# Patient Record
Sex: Male | Born: 1970 | Race: White | Hispanic: No | Marital: Married | Smoking: Current every day smoker
Health system: Southern US, Community
[De-identification: ages and names within clinical notes are randomized; demographics above are authoritative.]

## PROBLEM LIST (undated history)

## (undated) DIAGNOSIS — R011 Cardiac murmur, unspecified: Secondary | ICD-10-CM

## (undated) HISTORY — PX: NO PAST SURGERIES: SHX2092

---

## 2005-01-11 ENCOUNTER — Emergency Department: Payer: Self-pay | Admitting: Emergency Medicine

## 2005-07-03 ENCOUNTER — Emergency Department: Payer: Self-pay | Admitting: Internal Medicine

## 2006-10-14 ENCOUNTER — Emergency Department: Payer: Self-pay | Admitting: Emergency Medicine

## 2008-03-28 ENCOUNTER — Emergency Department: Payer: Self-pay | Admitting: Emergency Medicine

## 2008-08-01 ENCOUNTER — Emergency Department: Payer: Self-pay | Admitting: Internal Medicine

## 2011-07-04 ENCOUNTER — Observation Stay: Payer: Self-pay | Admitting: Internal Medicine

## 2011-07-04 LAB — CBC WITH DIFFERENTIAL/PLATELET
Basophil %: 0.2 %
Eosinophil %: 0.8 %
HCT: 50.5 % (ref 40.0–52.0)
HGB: 17.3 g/dL (ref 13.0–18.0)
Lymphocyte %: 18.7 %
MCH: 31.6 pg (ref 26.0–34.0)
MCHC: 34.2 g/dL (ref 32.0–36.0)
MCV: 92 fL (ref 80–100)
Monocyte #: 1 10*3/uL — ABNORMAL HIGH (ref 0.0–0.7)
Monocyte %: 7.1 %
Neutrophil %: 73.2 %
RDW: 12.6 % (ref 11.5–14.5)
WBC: 14.2 10*3/uL — ABNORMAL HIGH (ref 3.8–10.6)

## 2011-07-04 LAB — URINALYSIS, COMPLETE
Bacteria: NONE SEEN
Blood: NEGATIVE
Glucose,UR: NEGATIVE mg/dL (ref 0–75)
Leukocyte Esterase: NEGATIVE
Nitrite: NEGATIVE
Ph: 6 (ref 4.5–8.0)
RBC,UR: NONE SEEN /HPF (ref 0–5)
Squamous Epithelial: NONE SEEN
WBC UR: NONE SEEN /HPF (ref 0–5)

## 2011-07-04 LAB — COMPREHENSIVE METABOLIC PANEL
Albumin: 5.3 g/dL — ABNORMAL HIGH (ref 3.4–5.0)
Alkaline Phosphatase: 45 U/L — ABNORMAL LOW (ref 50–136)
Anion Gap: 12 (ref 7–16)
BUN: 11 mg/dL (ref 7–18)
Bilirubin,Total: 0.8 mg/dL (ref 0.2–1.0)
Calcium, Total: 10.1 mg/dL (ref 8.5–10.1)
Creatinine: 0.98 mg/dL (ref 0.60–1.30)
EGFR (Non-African Amer.): >60
Glucose: 119 mg/dL — ABNORMAL HIGH (ref 65–99)
Osmolality: 286 (ref 275–301)
Total Protein: 8.7 g/dL — ABNORMAL HIGH (ref 6.4–8.2)

## 2011-07-04 LAB — LIPASE, BLOOD: Lipase: 168 U/L (ref 73–393)

## 2011-07-04 LAB — TROPONIN I: Troponin-I: <0.02 ng/mL

## 2011-07-05 LAB — CBC WITH DIFFERENTIAL/PLATELET
Basophil %: 0.1 %
Eosinophil #: 0.1 10*3/uL (ref 0.0–0.7)
HCT: 41 % (ref 40.0–52.0)
HGB: 13.9 g/dL (ref 13.0–18.0)
Lymphocyte #: 3.5 10*3/uL (ref 1.0–3.6)
Lymphocyte %: 32.5 %
MCH: 31.2 pg (ref 26.0–34.0)
MCHC: 33.8 g/dL (ref 32.0–36.0)
Monocyte #: 1 10*3/uL — ABNORMAL HIGH (ref 0.0–0.7)
Neutrophil %: 56.3 %
RBC: 4.44 10*6/uL (ref 4.40–5.90)

## 2011-07-09 LAB — CULTURE, BLOOD (SINGLE)

## 2011-08-20 ENCOUNTER — Emergency Department: Payer: Self-pay | Admitting: Unknown Physician Specialty

## 2011-08-20 ENCOUNTER — Emergency Department: Payer: Self-pay

## 2011-10-11 LAB — CBC
HCT: 49.4 % (ref 40.0–52.0)
MCH: 31.1 pg (ref 26.0–34.0)
MCHC: 34.9 g/dL (ref 32.0–36.0)
MCV: 89 fL (ref 80–100)
RBC: 5.54 10*6/uL (ref 4.40–5.90)
RDW: 13 % (ref 11.5–14.5)
WBC: 22 10*3/uL — ABNORMAL HIGH (ref 3.8–10.6)

## 2011-10-11 LAB — COMPREHENSIVE METABOLIC PANEL
Albumin: 4.9 g/dL (ref 3.4–5.0)
BUN: 11 mg/dL (ref 7–18)
Bilirubin,Total: 0.7 mg/dL (ref 0.2–1.0)
Calcium, Total: 9.9 mg/dL (ref 8.5–10.1)
Co2: 25 mmol/L (ref 21–32)
Creatinine: 1.12 mg/dL (ref 0.60–1.30)
EGFR (African American): >60
EGFR (Non-African Amer.): >60
Osmolality: 276 (ref 275–301)
Total Protein: 8.5 g/dL — ABNORMAL HIGH (ref 6.4–8.2)

## 2011-10-12 ENCOUNTER — Inpatient Hospital Stay: Payer: Self-pay | Admitting: Internal Medicine

## 2011-10-12 LAB — CBC WITH DIFFERENTIAL/PLATELET
Basophil #: 0.1 10*3/uL (ref 0.0–0.1)
Basophil %: 0.4 %
Eosinophil #: 0.2 10*3/uL (ref 0.0–0.7)
HCT: 44.3 % (ref 40.0–52.0)
HGB: 15.6 g/dL (ref 13.0–18.0)
Lymphocyte %: 24.3 %
MCH: 31.7 pg (ref 26.0–34.0)
MCHC: 35.3 g/dL (ref 32.0–36.0)
MCV: 90 fL (ref 80–100)
Monocyte %: 9 %
Neutrophil #: 9.1 10*3/uL — ABNORMAL HIGH (ref 1.4–6.5)
Neutrophil %: 64.7 %
Platelet: 247 10*3/uL (ref 150–440)
WBC: 14.1 10*3/uL — ABNORMAL HIGH (ref 3.8–10.6)

## 2011-10-12 LAB — OCCULT BLOOD X 1 CARD TO LAB, STOOL: Occult Blood, Feces: NEGATIVE

## 2011-10-14 LAB — STOOL CULTURE

## 2011-10-17 LAB — CULTURE, BLOOD (SINGLE)

## 2011-11-06 LAB — CBC WITH DIFFERENTIAL/PLATELET
Basophil %: 0.9 %
Eosinophil %: 3.8 %
Lymphocyte #: 3.3 10*3/uL (ref 1.0–3.6)
MCH: 31.4 pg (ref 26.0–34.0)
MCHC: 34.7 g/dL (ref 32.0–36.0)
Monocyte #: 0.8 x10 3/mm (ref 0.2–1.0)
Neutrophil %: 52.7 %
Platelet: 262 10*3/uL (ref 150–440)
RBC: 5.44 10*6/uL (ref 4.40–5.90)
RDW: 12.9 % (ref 11.5–14.5)

## 2011-11-06 LAB — ACETAMINOPHEN LEVEL: Acetaminophen: 8 ug/mL — ABNORMAL LOW

## 2011-11-06 LAB — COMPREHENSIVE METABOLIC PANEL
Albumin: 4.8 g/dL (ref 3.4–5.0)
Bilirubin,Total: 0.5 mg/dL (ref 0.2–1.0)
Chloride: 105 mmol/L (ref 98–107)
Co2: 27 mmol/L (ref 21–32)
EGFR (African American): >60
Glucose: 89 mg/dL (ref 65–99)
Potassium: 3.8 mmol/L (ref 3.5–5.1)

## 2011-11-06 LAB — DRUG SCREEN, URINE
Barbiturates, Ur Screen: NEGATIVE (ref ?–200)
Cannabinoid 50 Ng, Ur ~~LOC~~: NEGATIVE (ref ?–50)
Methadone, Ur Screen: NEGATIVE (ref ?–300)
Opiate, Ur Screen: POSITIVE (ref ?–300)
Phencyclidine (PCP) Ur S: NEGATIVE (ref ?–25)
Tricyclic, Ur Screen: NEGATIVE (ref ?–1000)

## 2011-11-06 LAB — SALICYLATE LEVEL: Salicylates, Serum: 4.3 mg/dL — ABNORMAL HIGH

## 2011-11-07 ENCOUNTER — Inpatient Hospital Stay: Payer: Self-pay | Admitting: Psychiatry

## 2012-10-04 ENCOUNTER — Emergency Department: Payer: Self-pay | Admitting: Emergency Medicine

## 2012-10-04 LAB — COMPREHENSIVE METABOLIC PANEL
Bilirubin,Total: 0.9 mg/dL (ref 0.2–1.0)
Calcium, Total: 10.3 mg/dL — ABNORMAL HIGH (ref 8.5–10.1)
Co2: 24 mmol/L (ref 21–32)
Glucose: 140 mg/dL — ABNORMAL HIGH (ref 65–99)
Potassium: 3.8 mmol/L (ref 3.5–5.1)
SGPT (ALT): 25 U/L (ref 12–78)
Sodium: 139 mmol/L (ref 136–145)

## 2012-10-04 LAB — URINALYSIS, COMPLETE
Glucose,UR: NEGATIVE mg/dL (ref 0–75)
Nitrite: NEGATIVE
Ph: 6 (ref 4.5–8.0)
Protein: 150
Specific Gravity: 1.025 (ref 1.003–1.030)

## 2012-10-04 LAB — CBC
HCT: 54.9 % — ABNORMAL HIGH (ref 40.0–52.0)
HGB: 18.9 g/dL — ABNORMAL HIGH (ref 13.0–18.0)
HGB: 16.6 g/dL (ref 13.0–18.0)
MCH: 29.7 pg (ref 26.0–34.0)
MCHC: 34.8 g/dL (ref 32.0–36.0)
MCV: 86 fL (ref 80–100)
MCV: 87 fL (ref 80–100)
RDW: 13.1 % (ref 11.5–14.5)
RDW: 13.4 % (ref 11.5–14.5)
WBC: 24.8 10*3/uL — ABNORMAL HIGH (ref 3.8–10.6)

## 2012-10-04 LAB — LIPASE, BLOOD: Lipase: 134 U/L (ref 73–393)

## 2012-10-04 LAB — DRUG SCREEN, URINE
Amphetamines, Ur Screen: NEGATIVE (ref ?–1000)
Cannabinoid 50 Ng, Ur ~~LOC~~: NEGATIVE (ref ?–50)
Phencyclidine (PCP) Ur S: NEGATIVE (ref ?–25)
Tricyclic, Ur Screen: NEGATIVE (ref ?–1000)

## 2012-10-05 ENCOUNTER — Inpatient Hospital Stay: Payer: Self-pay | Admitting: Internal Medicine

## 2012-10-05 ENCOUNTER — Emergency Department: Payer: Self-pay | Admitting: Emergency Medicine

## 2012-10-05 LAB — URINALYSIS, COMPLETE
Blood: NEGATIVE
Glucose,UR: NEGATIVE mg/dL (ref 0–75)
Ketone: NEGATIVE
Ph: 6 (ref 4.5–8.0)
Protein: 25
RBC,UR: 2 /HPF (ref 0–5)
Specific Gravity: 1.03 (ref 1.003–1.030)
Squamous Epithelial: 1

## 2012-10-05 LAB — CBC WITH DIFFERENTIAL/PLATELET
Basophil #: 0 10*3/uL (ref 0.0–0.1)
Basophil %: 0.1 %
Eosinophil %: 0.1 %
Lymphocyte #: 1.5 10*3/uL (ref 1.0–3.6)
MCH: 30.2 pg (ref 26.0–34.0)
MCHC: 34.9 g/dL (ref 32.0–36.0)
MCV: 87 fL (ref 80–100)
Monocyte #: 2.2 x10 3/mm — ABNORMAL HIGH (ref 0.2–1.0)
Monocyte %: 7.8 %
Neutrophil #: 24.1 10*3/uL — ABNORMAL HIGH (ref 1.4–6.5)
Neutrophil %: 86.7 %
RDW: 13 % (ref 11.5–14.5)
WBC: 27.9 10*3/uL — ABNORMAL HIGH (ref 3.8–10.6)

## 2012-10-06 LAB — CBC WITH DIFFERENTIAL/PLATELET
Basophil %: 0.4 %
Eosinophil #: 0.1 10*3/uL (ref 0.0–0.7)
HCT: 42.3 % (ref 40.0–52.0)
Lymphocyte #: 2.8 10*3/uL (ref 1.0–3.6)
MCH: 30.5 pg (ref 26.0–34.0)
MCHC: 35.4 g/dL (ref 32.0–36.0)
MCV: 86 fL (ref 80–100)
Monocyte #: 1.8 x10 3/mm — ABNORMAL HIGH (ref 0.2–1.0)
Monocyte %: 11.1 %
Neutrophil #: 11.1 10*3/uL — ABNORMAL HIGH (ref 1.4–6.5)
Neutrophil %: 70.2 %
Platelet: 197 10*3/uL (ref 150–440)
RDW: 12.8 % (ref 11.5–14.5)
WBC: 15.8 10*3/uL — ABNORMAL HIGH (ref 3.8–10.6)

## 2012-10-06 LAB — BASIC METABOLIC PANEL
Anion Gap: 6 — ABNORMAL LOW (ref 7–16)
BUN: 28 mg/dL — ABNORMAL HIGH (ref 7–18)
Calcium, Total: 8.3 mg/dL — ABNORMAL LOW (ref 8.5–10.1)
Chloride: 107 mmol/L (ref 98–107)
Co2: 27 mmol/L (ref 21–32)
EGFR (African American): >60
Glucose: 107 mg/dL — ABNORMAL HIGH (ref 65–99)
Osmolality: 285 (ref 275–301)
Potassium: 3.2 mmol/L — ABNORMAL LOW (ref 3.5–5.1)

## 2012-10-06 LAB — CLOSTRIDIUM DIFFICILE BY PCR

## 2012-10-06 LAB — WBCS, STOOL

## 2012-10-11 LAB — CULTURE, BLOOD (SINGLE)

## 2013-02-23 IMAGING — CR DG ABDOMEN 3V
1 series · 6 of 6 positions shown · non-contrast
Comparison: none

REASON FOR EXAM: vomiting abd pain
COMMENTS:

[Series 1: w chest pa · 0.14mm/px · 6 of 6 slices shown]
[im 1/6]
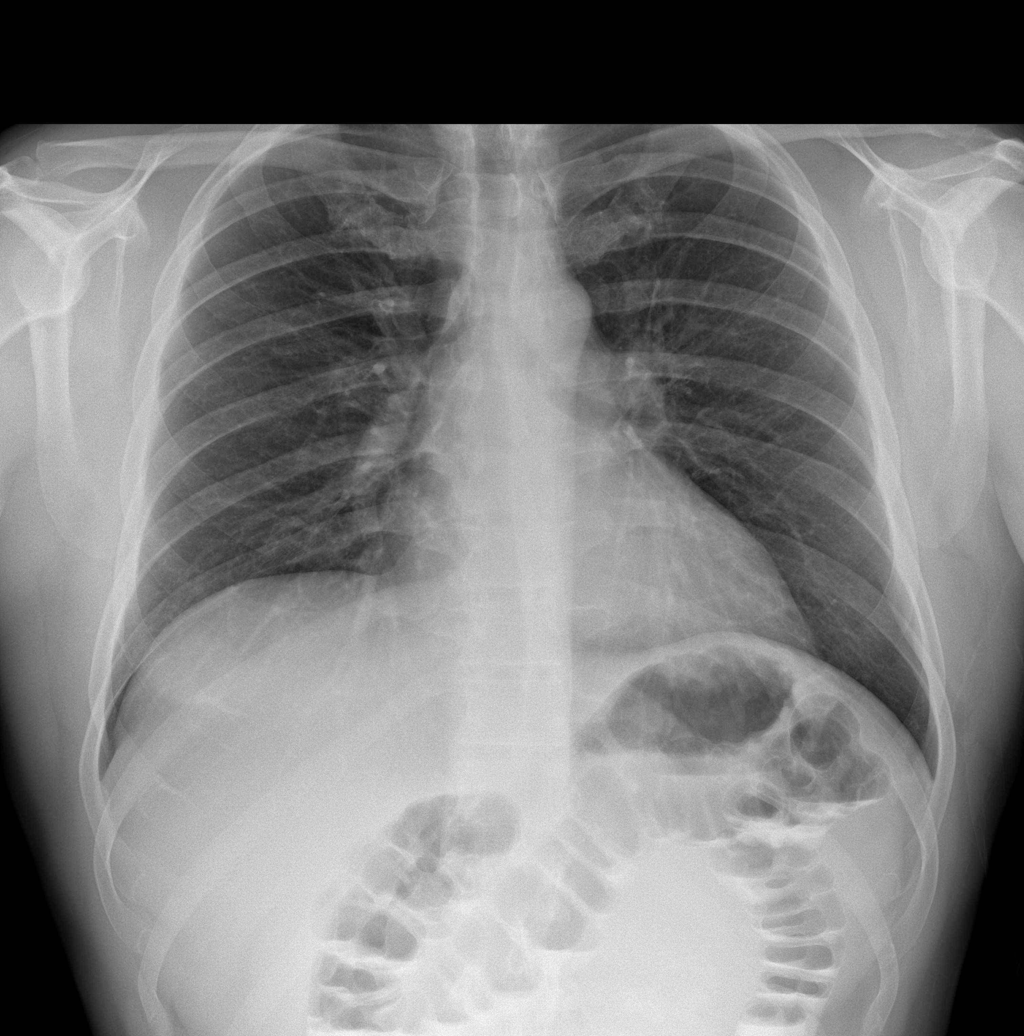
[im 2/6]
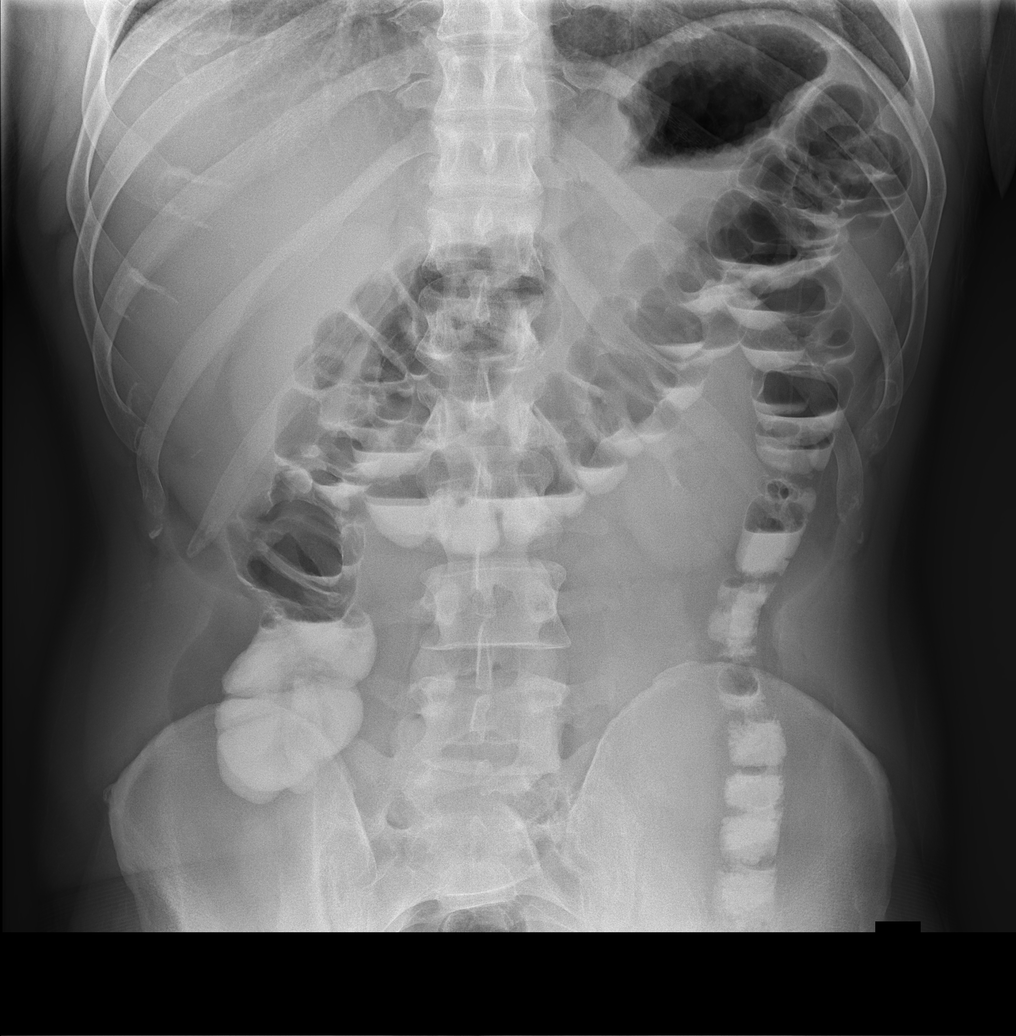
[im 3/6]
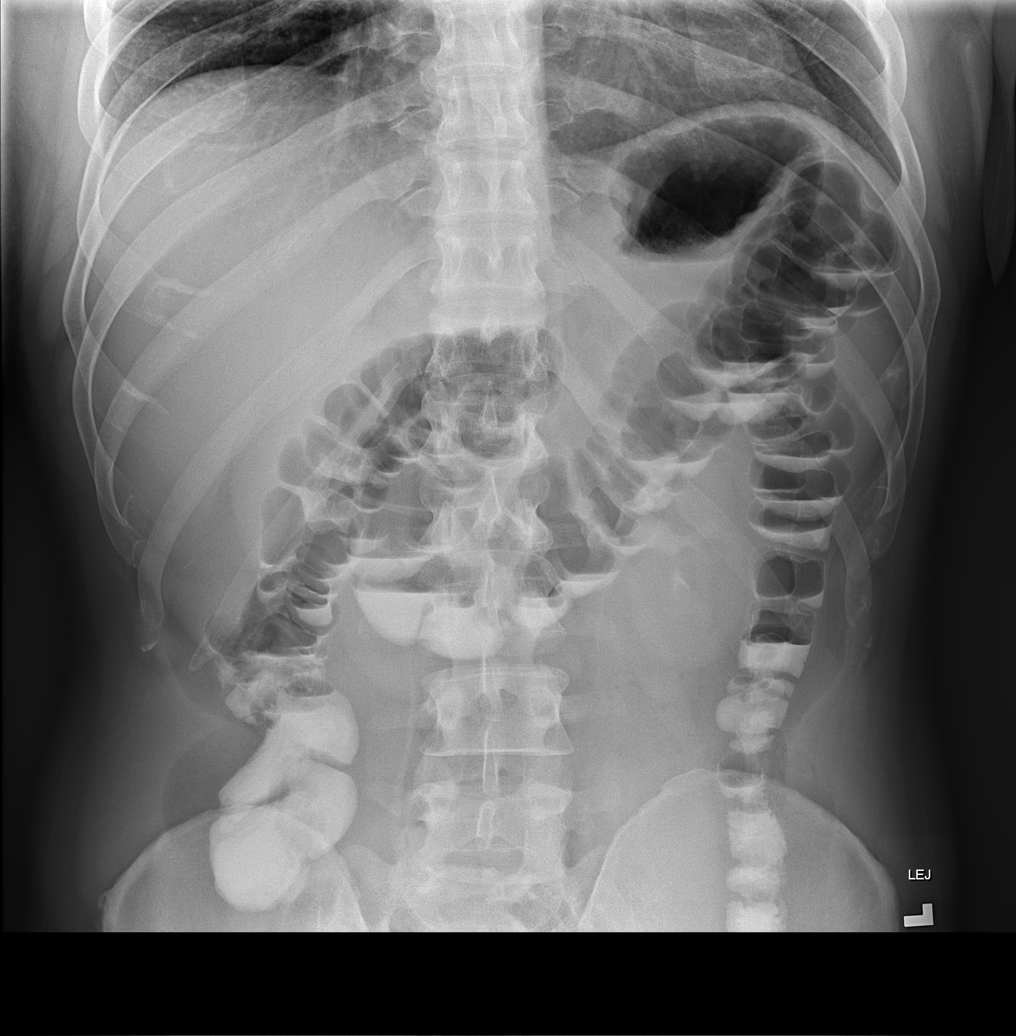
[im 4/6]
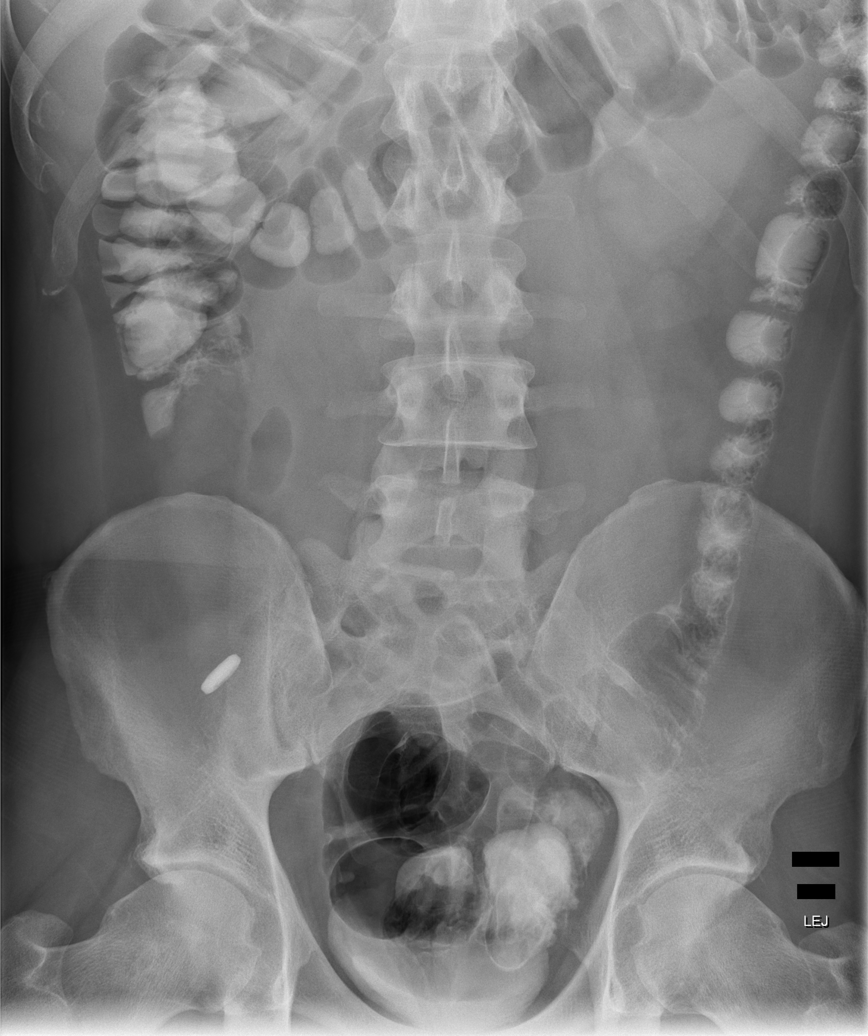
[im 5/6]
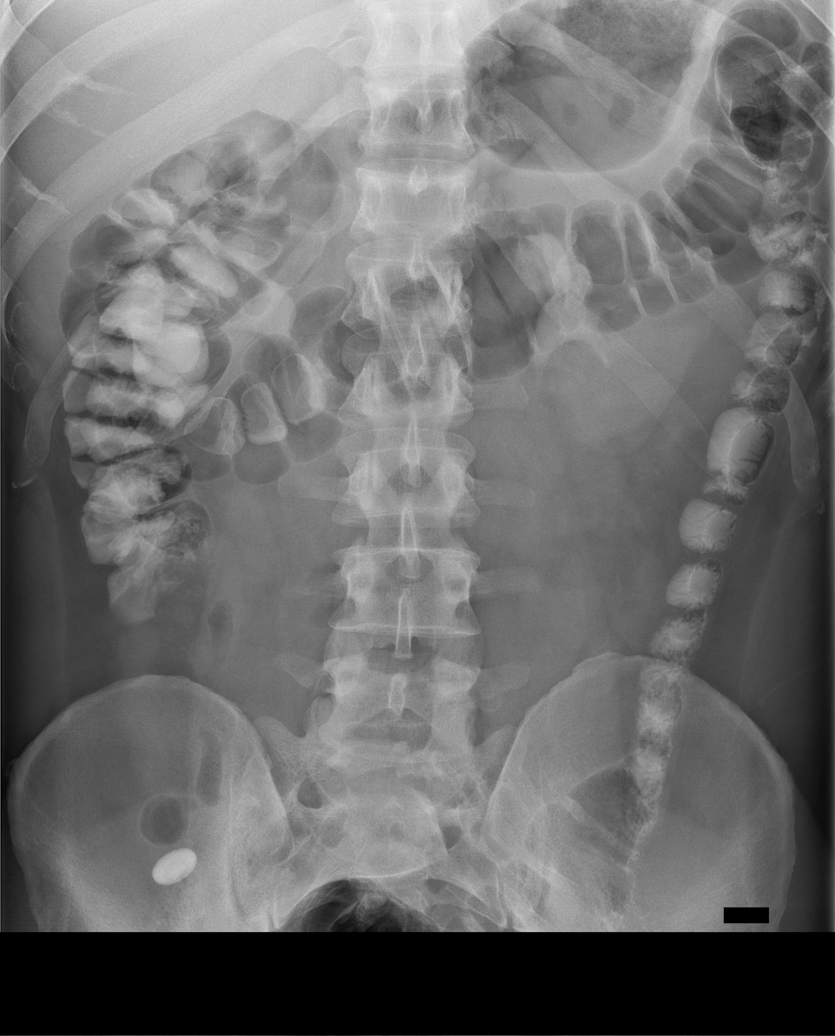
[im 6/6]
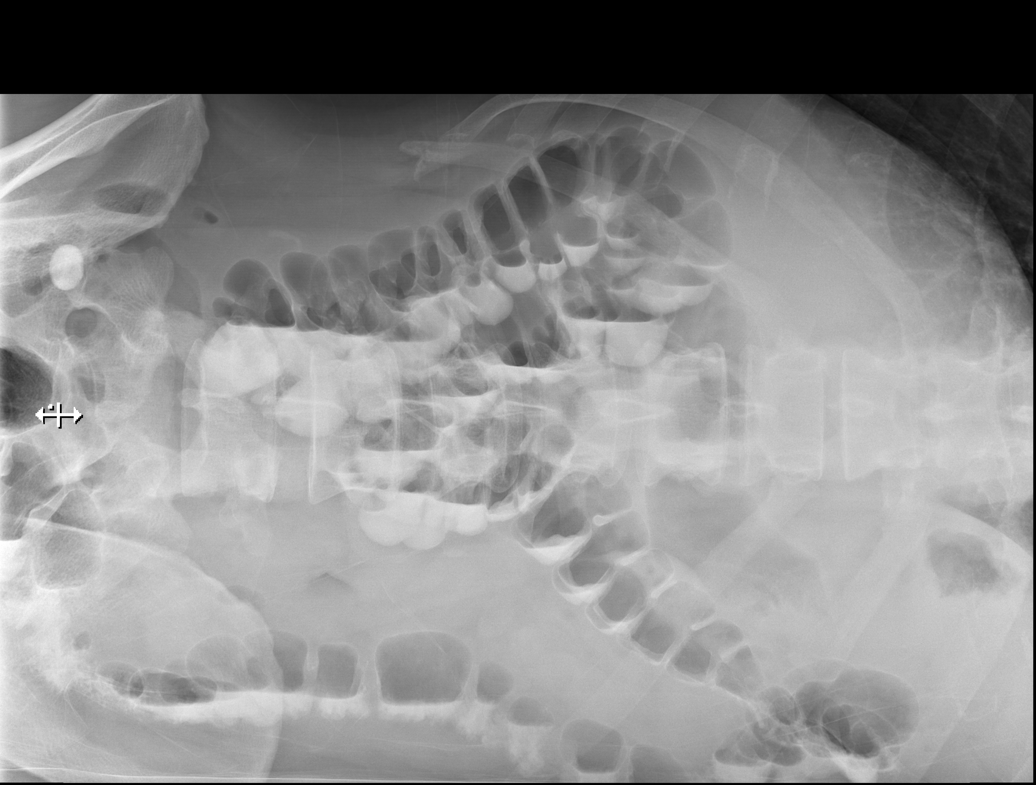

[6 of 6 positions shown; findings below may reference images not displayed]

PROCEDURE:     DXR - DXR ABDOMEN 3-WAY (INCL PA CXR)  - July 04, 2011  [DATE]

RESULT:     There is mild elevation of the right hemidiaphragm. There is
slightly shallow inspiration. Right lung base atelectasis is present. The
cardiac silhouette appears normal. There is oral contrast in the colon from
the recent CT of the abdomen and pelvis of the same date. An elliptical
density projects over the right iliac region which could represent a portion
of the patient's clothing or foreign body extrinsic to the patient or an
ingested tablet. There is excretion of contrast opacified urine by both
kidneys with accumulation in the bladder.
IMPRESSION: 1.     Shallow inspiration with right lung base atelectasis.
2.     No evidence of bowel obstruction or perforation.

## 2013-02-23 IMAGING — CT CT ABD-PELV W/ CM
1 of 2 series · 15 of 32 positions shown, 19 images · non-contrast
Comparison: none

REASON FOR EXAM: (1) abd pain; (2) pel pain
COMMENTS:

PROCEDURE:     CT  - CT ABDOMEN / PELVIS  W  - July 04, 2011 [DATE]
RESULT:     History: Pain.
Comparison study: Abdominal ultrasound of 07/04/2011. CT [DATE].

[Series 2: 3mm soft tissue · axial · 0.68mm/px · z∈[-852,-372]mm · 15 of 178 slices shown, 19 images]
[im 9/178  soft-tissue]
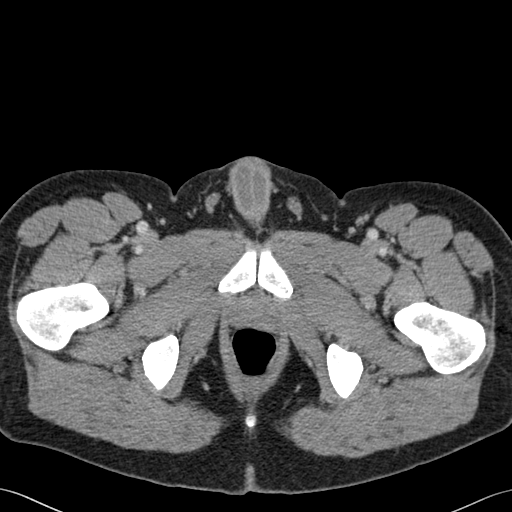
[im 9/178  bone]
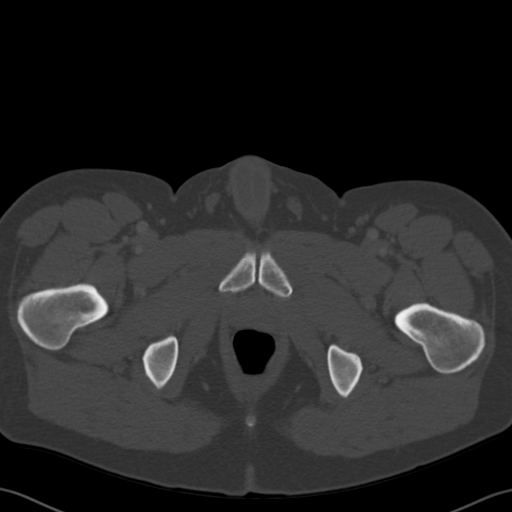
[im 25/178  soft-tissue]
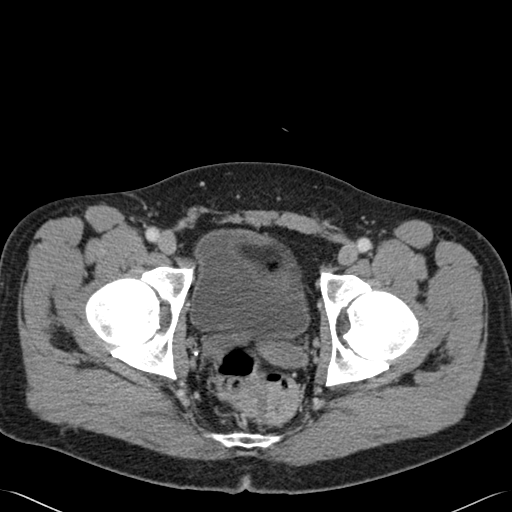
[im 41/178  soft-tissue]
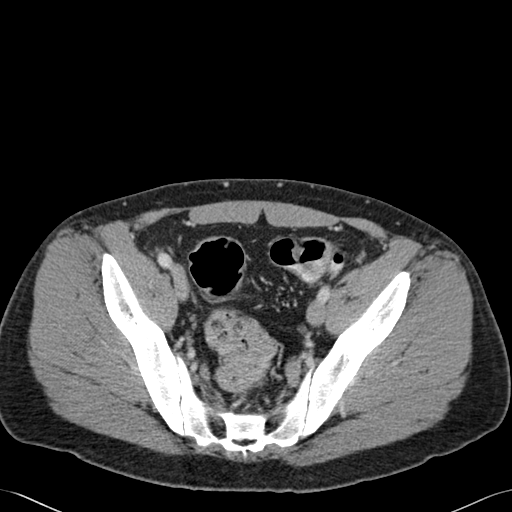
[im 49/178  soft-tissue]
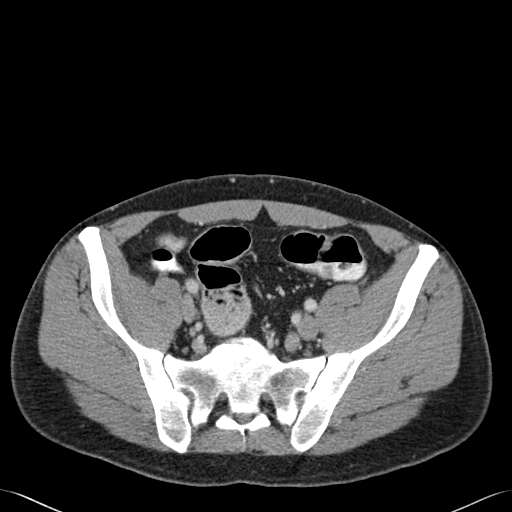
[im 65/178  soft-tissue]
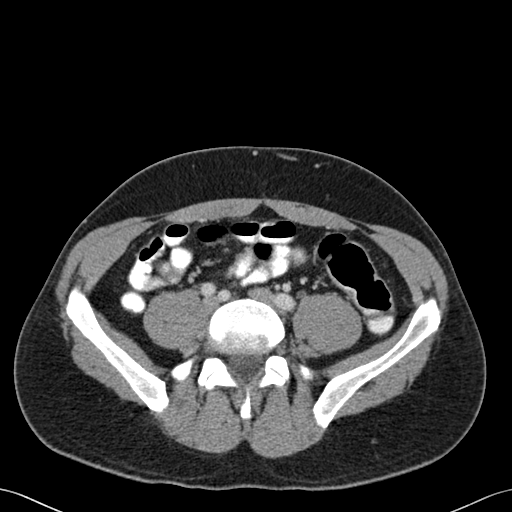
[im 73/178  soft-tissue]
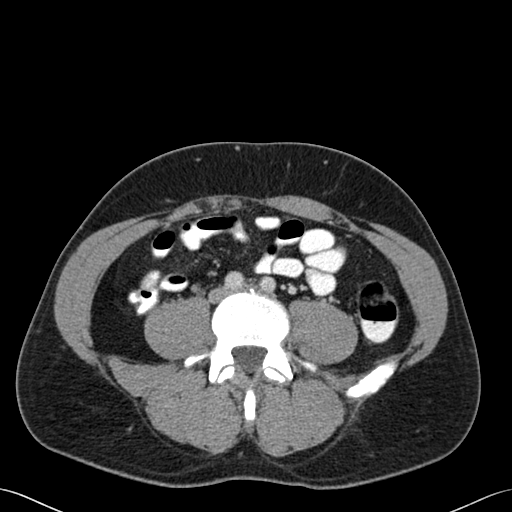
[im 89/178  soft-tissue]
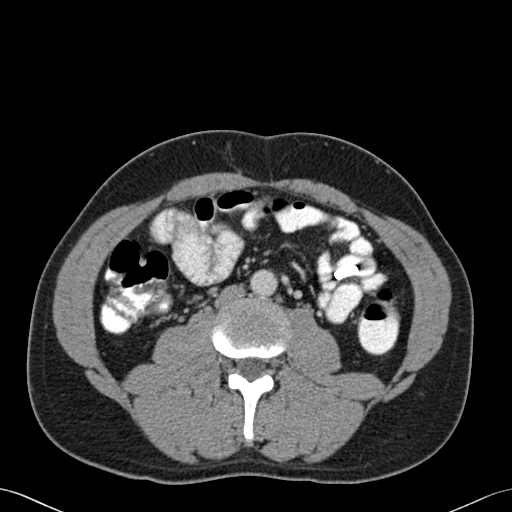
[im 105/178  soft-tissue]
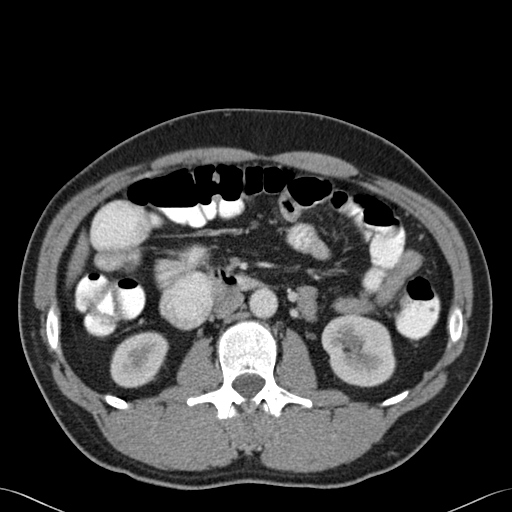
[im 113/178  soft-tissue]
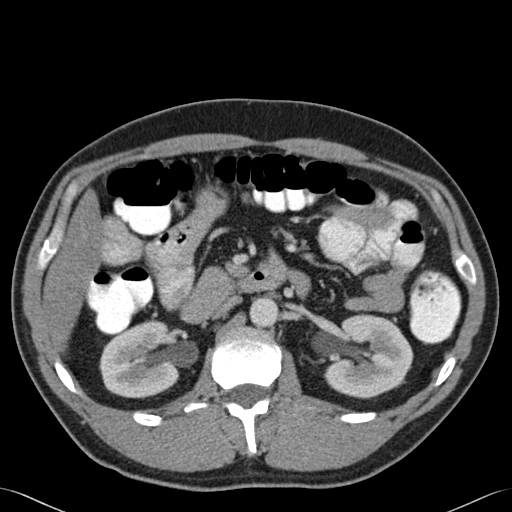
[im 113/178  bone]
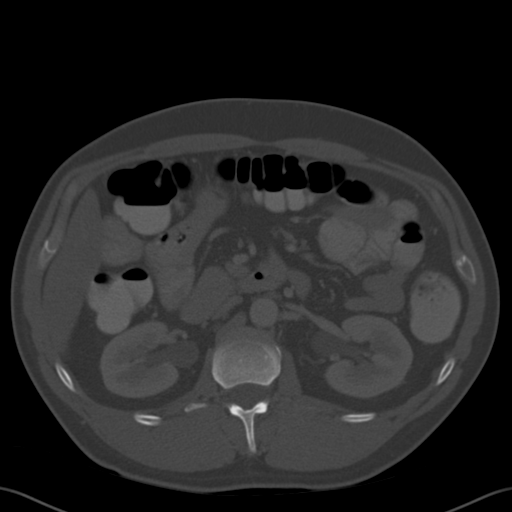
[im 129/178  soft-tissue]
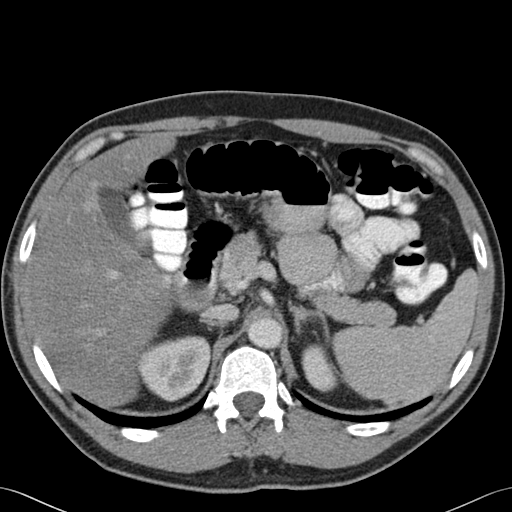
[im 137/178  soft-tissue]
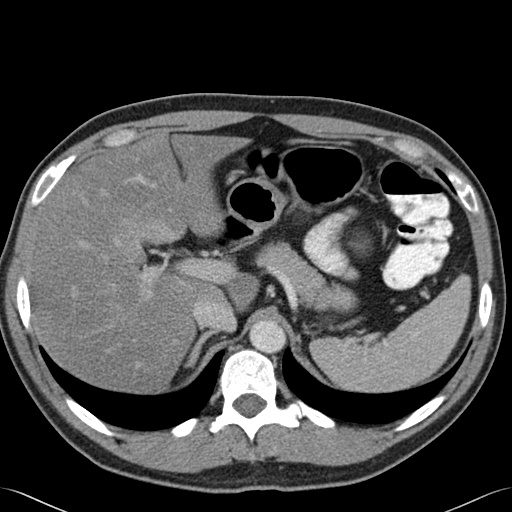
[im 145/178  lung]
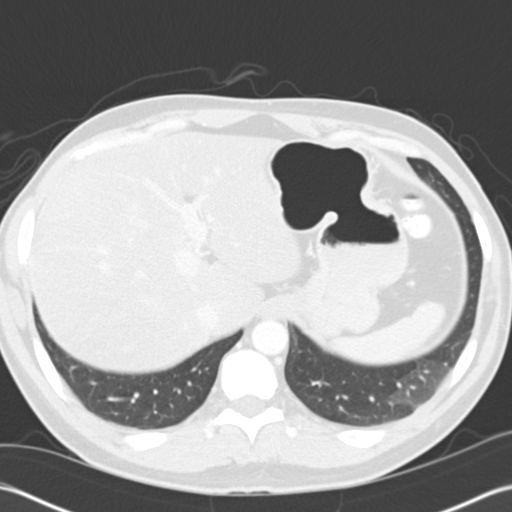
[im 153/178  soft-tissue]
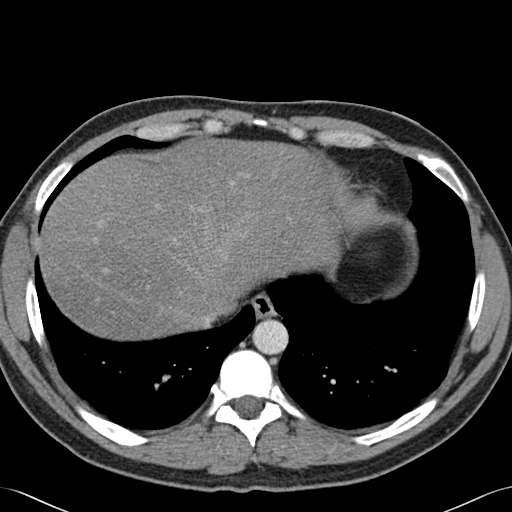
[im 153/178  lung]
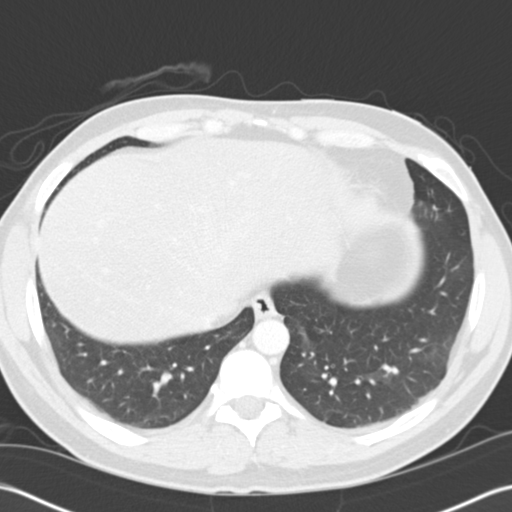
[im 161/178  lung]
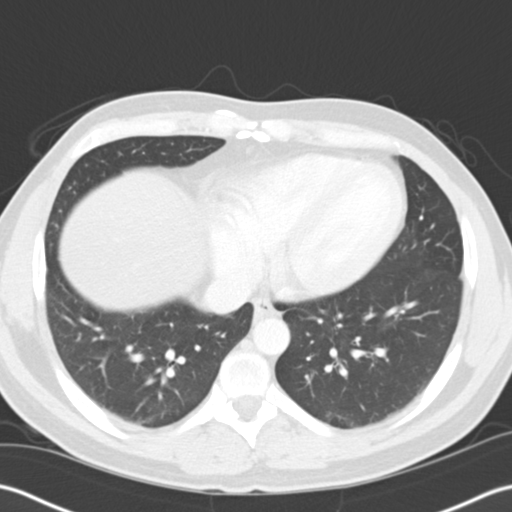
[im 169/178  soft-tissue]
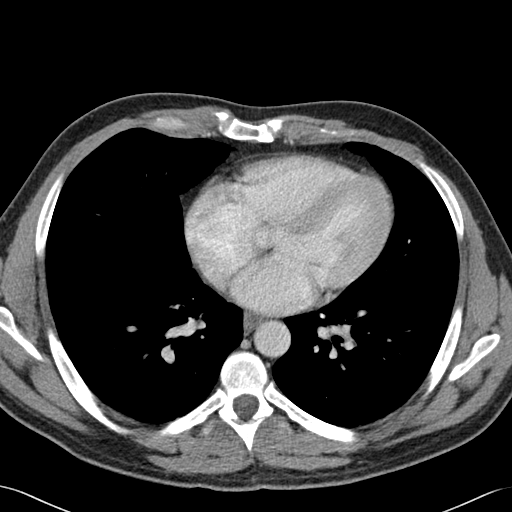
[im 169/178  lung]
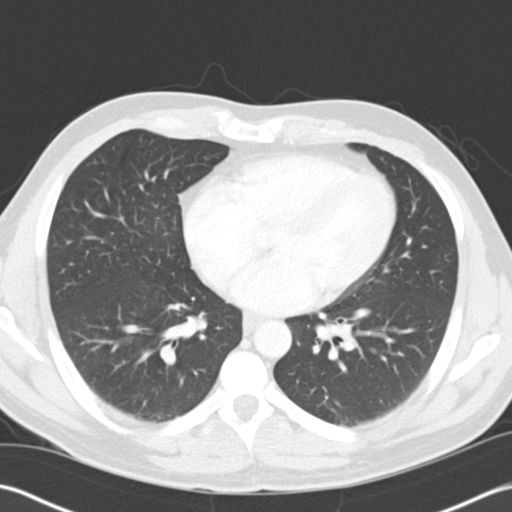

[15 of 32 positions shown; findings below may reference images not displayed]

FINDINGS: Standard CT obtained 100 cc of [id number]. Liver normal.
Gallbladder nondistended. Gallbladder wall thickness normal. No
pericholecystic fluid noted. Splenosis noted. Spleen normal. Pancreas
normal. Adrenals normal. Kidneys normal. No hydronephrosis or hydroureter.
Bladder nondistended. Calcification within the prostate. The contour the
prostate is slightly irregular. Clinical correlation suggested. Aorta
nondistended. Appendix normal. Mildly distended proximal small bowel loops
are noted. To exclude a developing small bowel obstruction followup
three-way of the abdomen is suggested. No colonic distention. No
inflammatory changes no the right or left lower quadrants. No free air. Mild
atelectasis.
IMPRESSION: 1. Mild distention of proximal small bowel loops. To exclude developing
small bowel obstruction followup three-way abdomen suggested.
2. Calcification within the prostate. Prostate contour slightly irregular.
Clinical correlation suggested.

## 2013-02-24 IMAGING — CR DG ABDOMEN 3V
1 series · 4 of 4 positions shown · non-contrast
Comparison: none

REASON FOR EXAM: f/u to r/o SBO
COMMENTS:

PROCEDURE:     DXR - DXR ABDOMEN COMPLETE  - July 05, 2011  [DATE]
RESULT:
Comparison is made to prior study dated 07-04-2011.

[Series 1: erect ap · 0.17mm/px · 4 of 4 slices shown]
[im 1/4]
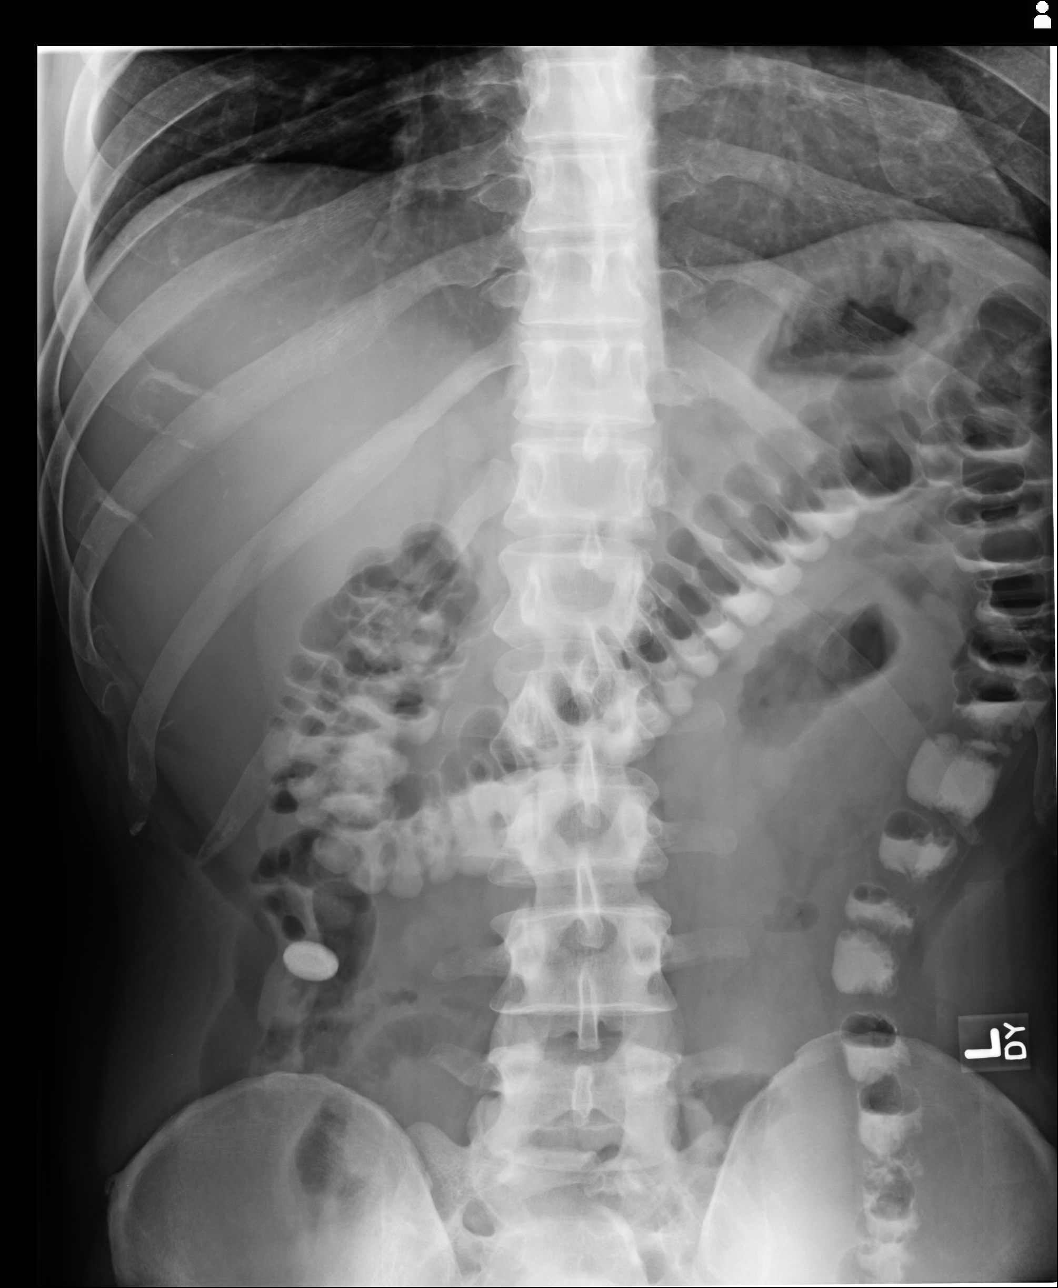
[im 2/4]
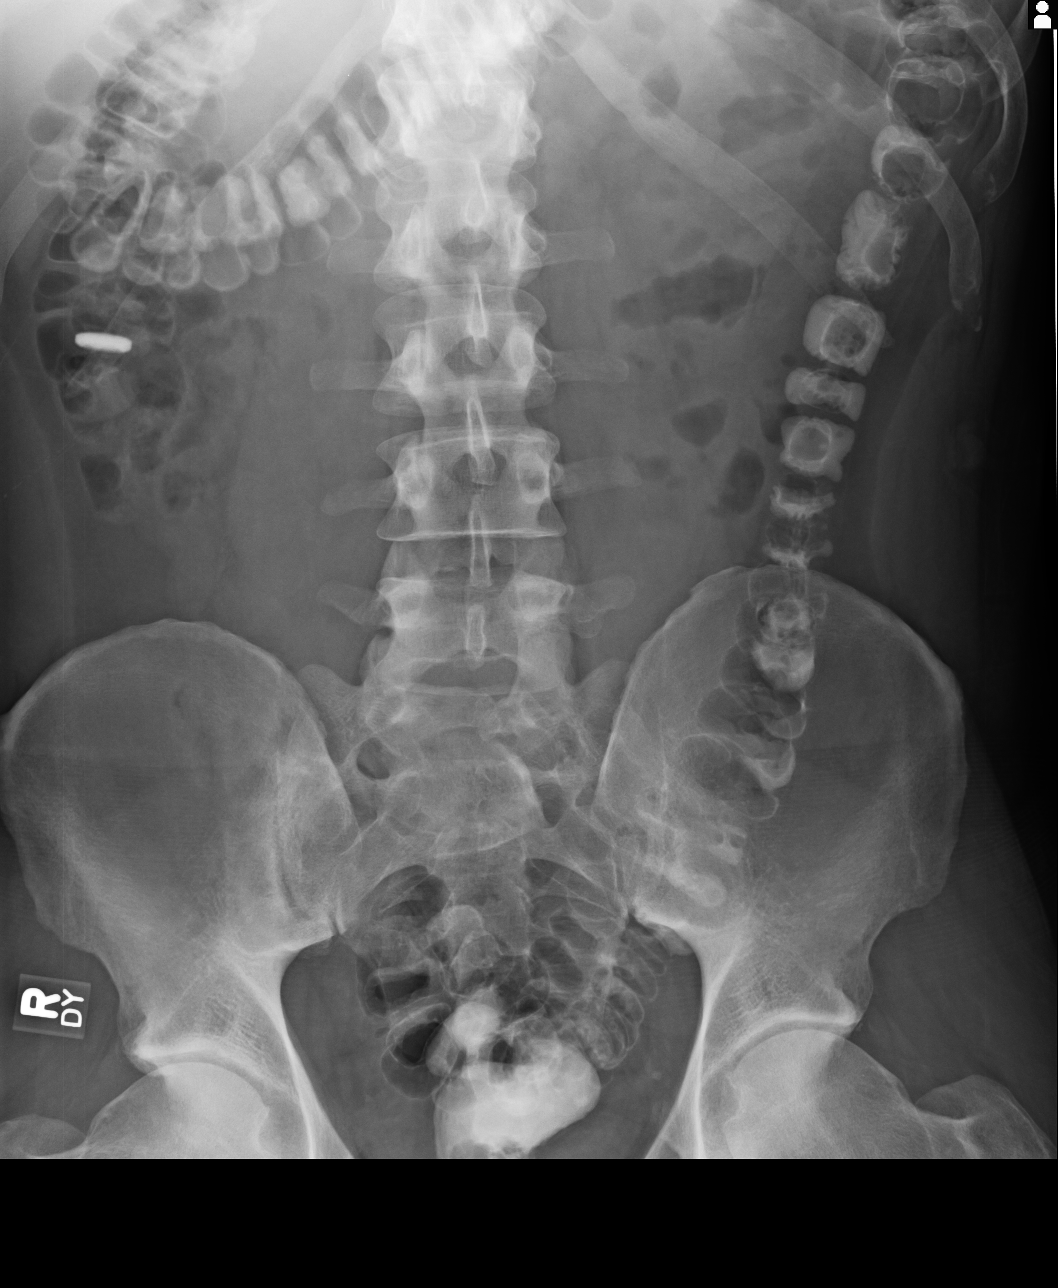
[im 3/4]
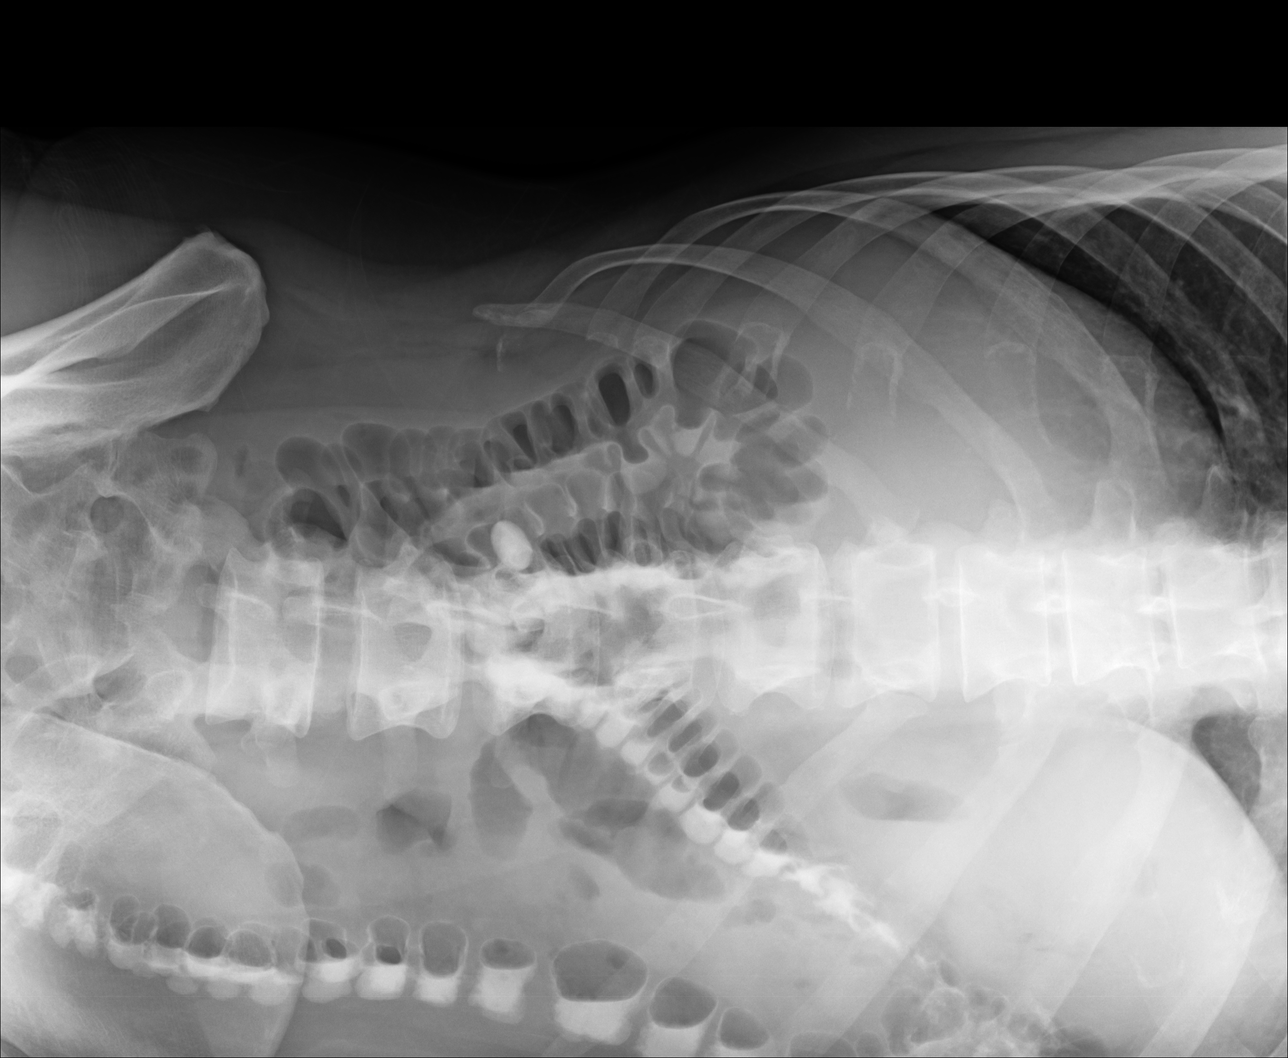
[im 4/4]
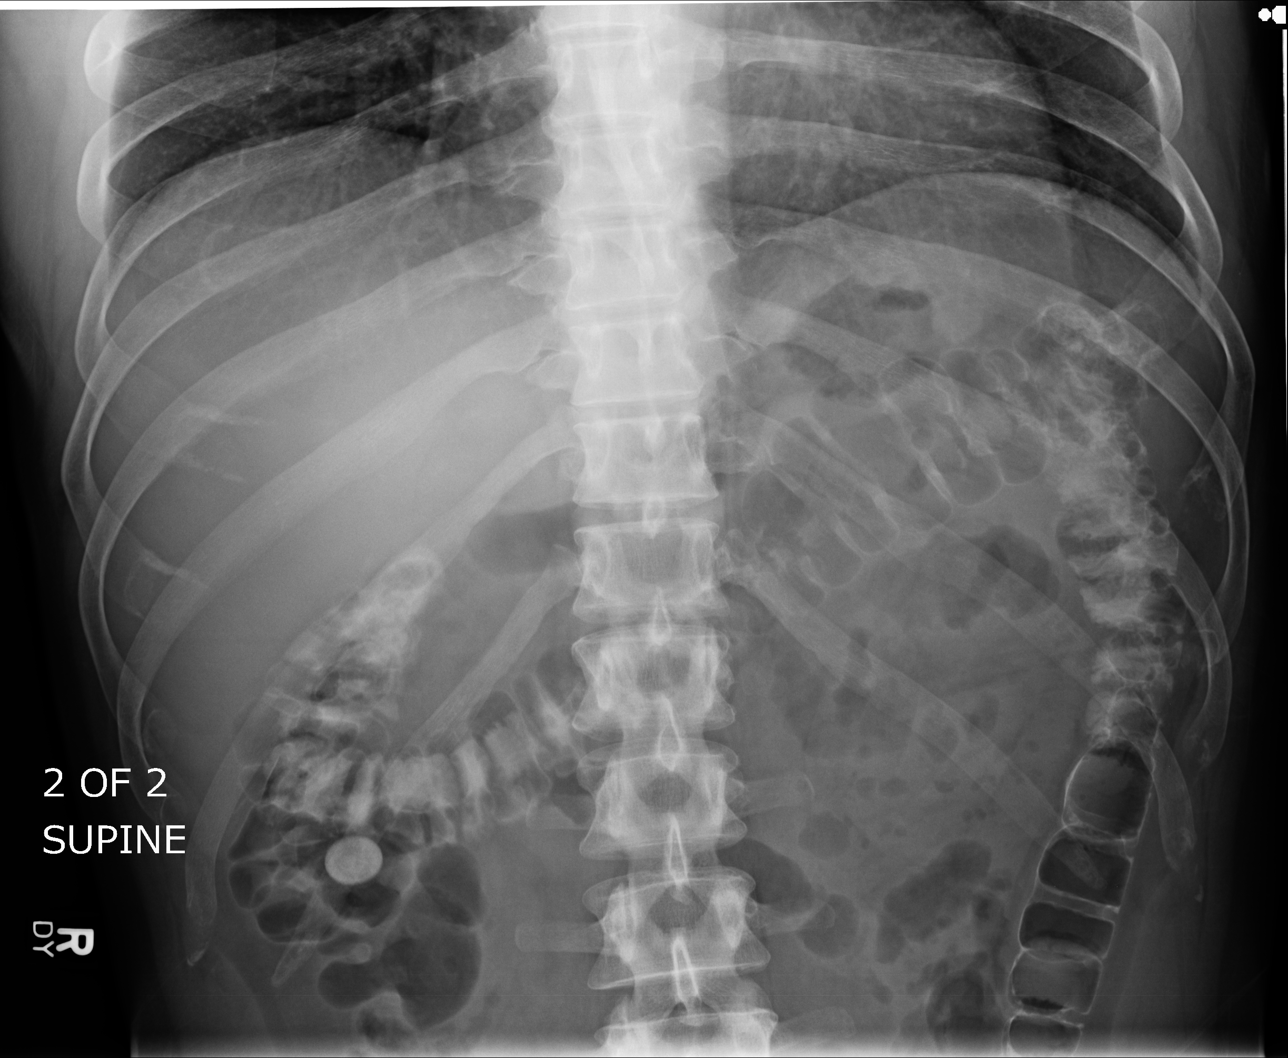

[4 of 4 positions shown; findings below may reference images not displayed]

FINDINGS: Air is seen within nondilated loops of large and small bowel as
well as small to moderate amount of residual contrast. A rounded area of
high density projects within the right lower quadrant region. This area has
a rounded appearance and may represent external artifact possibly a
previously injected foreign body, possibly metallic. The visualized bony
skeleton is unremarkable. There is no evidence of free air.
IMPRESSION: 1. Nonobstructive bowel gas pattern.

2. Residual contrast within bowel.

## 2013-04-18 ENCOUNTER — Inpatient Hospital Stay: Payer: Self-pay | Admitting: Psychiatry

## 2013-04-18 LAB — URINALYSIS, COMPLETE
Bacteria: NONE SEEN
Blood: NEGATIVE
Glucose,UR: NEGATIVE mg/dL (ref 0–75)
Hyaline Cast: 13
Ketone: NEGATIVE
Leukocyte Esterase: NEGATIVE
Nitrite: NEGATIVE
Ph: 6 (ref 4.5–8.0)
Protein: 30
RBC,UR: 1 /HPF (ref 0–5)
Squamous Epithelial: <1

## 2013-04-18 LAB — COMPREHENSIVE METABOLIC PANEL
Albumin: 4.2 g/dL (ref 3.4–5.0)
Alkaline Phosphatase: 63 U/L (ref 50–136)
Anion Gap: 5 — ABNORMAL LOW (ref 7–16)
BUN: 14 mg/dL (ref 7–18)
Creatinine: 0.93 mg/dL (ref 0.60–1.30)
EGFR (African American): >60
EGFR (Non-African Amer.): >60
Glucose: 96 mg/dL (ref 65–99)
Osmolality: 278 (ref 275–301)
Potassium: 3.6 mmol/L (ref 3.5–5.1)
Sodium: 139 mmol/L (ref 136–145)

## 2013-04-18 LAB — ACETAMINOPHEN LEVEL
Acetaminophen: <2 ug/mL
Acetaminophen: <2 ug/mL

## 2013-04-18 LAB — CBC
HCT: 47.1 % (ref 40.0–52.0)
HGB: 16.4 g/dL (ref 13.0–18.0)
MCH: 30.9 pg (ref 26.0–34.0)
MCHC: 34.9 g/dL (ref 32.0–36.0)
MCV: 89 fL (ref 80–100)
Platelet: 191 10*3/uL (ref 150–440)
RBC: 5.32 10*6/uL (ref 4.40–5.90)

## 2013-04-18 LAB — DRUG SCREEN, URINE
Amphetamines, Ur Screen: NEGATIVE (ref ?–1000)
Cannabinoid 50 Ng, Ur ~~LOC~~: NEGATIVE (ref ?–50)
Cocaine Metabolite,Ur ~~LOC~~: NEGATIVE (ref ?–300)
Opiate, Ur Screen: POSITIVE (ref ?–300)
Tricyclic, Ur Screen: NEGATIVE (ref ?–1000)

## 2013-04-18 LAB — ETHANOL
Ethanol %: <0.003 % (ref 0.000–0.080)
Ethanol: <3 mg/dL

## 2013-04-18 LAB — TSH: Thyroid Stimulating Horm: 0.58 u[IU]/mL

## 2013-11-13 ENCOUNTER — Emergency Department: Payer: Self-pay | Admitting: Internal Medicine

## 2013-11-20 ENCOUNTER — Emergency Department: Payer: Self-pay | Admitting: Internal Medicine

## 2014-01-30 ENCOUNTER — Ambulatory Visit: Payer: Self-pay | Admitting: Unknown Physician Specialty

## 2014-05-29 ENCOUNTER — Ambulatory Visit: Payer: Self-pay | Admitting: Family Medicine

## 2014-06-05 ENCOUNTER — Ambulatory Visit: Payer: Self-pay | Admitting: Family Medicine

## 2014-08-10 ENCOUNTER — Ambulatory Visit: Payer: Self-pay | Admitting: Emergency Medicine

## 2014-09-29 NOTE — H&P (Signed)
PATIENT NAME:  Elouise MunroeCAPPS, Dontrail W MR#:  [id number] DATE OF BIRTH:  [birth year]  DATE OF ADMISSION:  10/05/2012  REFERRING PHYSICIAN: Dr. Cyril LoosenKinner  PRIMARY CARE PHYSICIAN:  None.     CHIEF COMPLAINT: Nausea, vomiting, abdominal pain.   HISTORY OF PRESENT ILLNESS: The patient is a 44 year old Caucasian male with history of nephrolithiasis, chronic pain, who abuses opiates, who does not follow with a doctor. This is the third visit to the ER in the last couple of days for nausea, vomiting, abdominal pain which started on Friday. The patient states abdominal pain is diffuse and he has had up to five episodes of  vomitus and diarrhea daily. The vomitus is yellowish to green and he has very heavy violent retching episodes and did have some streaks of blood from the retching. The patient also has diarrhea up to 5 times, watery. The pain in the abdomen is diffuse. He denies any sick contacts, recent antibiotics or fevers. He was evaluated prior in the ER and discharged from the ER. He had significant leukocytosis prior. U-tox is also positive for opiates and MDMA.   PAST MEDICAL HISTORY: History of nephrolithiasis, history of heart murmur, chronic pain and history of duodenitis in the past, status post EGD last year.   FAMILY HISTORY: Hypertension and mom with hyperlipidemia and diverticulitis.   PAST SURGICAL HISTORY: Denies.   SOCIAL HISTORY: Smokes 1/2 pack a day for 17 years. Occasional alcohol. Abuses opiates, which he gets  from family as well buys from  the streets. He has taken up to 4 or 5 pills a day of Vicodin and hydrocodone in the past, none for the past 6 days or so.   ALLERGIES: No known drug allergies.   OUTPATIENT MEDICATIONS: Per computer med writer  Carafate 1 gram 4 times a day, Catapres patch 0.1 mg per 24-hour transdermal film extended-release once a week. Maalox p.r.n. and promethazine p.r.n.; all prescribed recently while in the ER.   REVIEW OF SYSTEMS:  CONSTITUTIONAL: No fever or  fatigue. No weakness. No weight changes.  EYES: Had some blurry vision.  ENT: No tinnitus or hearing loss. No postnasal drip.  RESPIRATORY: No cough, wheezing, or shortness of breath. No chronic obstructive pulmonary disease.  CARDIOVASCULAR: No chest pain or orthopnea. No hypertension.  GASTROINTESTINAL: Positive for nausea, vomiting and diarrhea as above. Positive for abdominal pain which is diffuse. No radiation, no melena or bloody stools. No rectal bleeding.  GENITOURINARY: No dysuria, but has dark urine.  ENDOCRINE: No polyuria or nocturia.  HEMATOLOGIC/LYMPHATIC: No anemia or easy bruising.  SKIN: No rashes.  MUSCULOSKELETAL: Has chronic back pain.  NEUROLOGIC: Denies weakness, numbness. No stroke or transient ischemic attack.  He had a bout of hallucination today or he saw objects.  PSYCHIATRIC: No history of bipolar, has some anxiety and depression.   PHYSICAL EXAMINATION: VITAL SIGNS: Temperature 99, pulse 67, respiratory rate 18, blood pressure 129/84, oxygen saturation 96% on room air.  GENERAL: The patient is well developed Caucasian male lying in bed. Looks uncomfortable, unkempt appearing.  HEENT: Normocephalic, atraumatic. Pupils are equal and reactive. Anicteric sclerae. Extraocular muscles intact. Dry mucous membranes.  NECK: Supple. No thyroid tenderness or cervical lymphadenopathy.  CARDIOVASCULAR: S1, S2 regular rate and rhythm. No murmurs, rubs or gallops.  LUNGS: Clear to auscultation. No wheezing or rhonchi.  ABDOMEN: Diffuse abdominal pain. No rebound or guarding. Hyperactive bowel sounds in all quadrants.  EXTREMITIES: No significant lower extremity edema. NEUROLOLGIC:  Cranial nerves II through XII grossly intact. Strength  is 5/5 in all extremities.  Sensation is intact to light touch.  SKIN: No obvious rashes.  PSYCHIATRIC: Awake, alert, oriented x 3, little withdrawn.   LABORATORY, DIAGNOSTIC AND RADIOLOGIC DATA:  Yesterday's labs showed glucose 140, BUN 17,  creatinine 1.16, lipase of 134, albumin 5.3. Yesterday, the patient had opiates as well as MDMA in the urine and had leukocytosis of 19.5000, hematocrit 16.6, platelets of 222.   Urinalysis showed specific gravity of 1.03. No nitrites or leukocyte esterase, 2 RBCs, 1 WBC.   EKG: Normal sinus rhythm, rate 67. Q waves in 3, no acute ST elevations or depressions.   CT of abdomen and pelvis with contrast shows diffuse gastroduodenal and small bowel wall thickening with mild distention of the small bowel. Gastroenteritis could present this way. Foci of subtle intussusception are noted with moderate small bowel distention.   ASSESSMENT AND PLAN:  We have 44 year old Caucasian male with history of opiates dependence and abuse, tobacco abuse, who presents with nausea, vomiting, diarrhea for a few days, associated abdominal pain, leukocytosis and a low-grade fevers. The patient feels uncomfortable. CT scan shows possible gastroenteritis, which possibly likely infectious. He has been evaluated for surgery. There are no acute surgical indications per them. We will admit the patient to the hospital. We will obtain stool cultures as well as Clostridium difficile, as he has significant leukocytosis which could also be reactive. We will follow blood cultures as well as stool cultures. He did not have any dysuria otherwise. He does have hemoconcentration per labs and is clinically dehydrated and we will start him on IV fluids, Zofran as well as morphine as needed for his pain. He was counseled extensively about polysubstance abuse as well as MDMA and opiates, but he denied MDMA usage. His family, including wife and mother are present. He was counseled for 3 minutes about his tobacco abuse and started him on a nicotine patch. He did have a bout of hallucination, which could have been from the substances he has been abusing. He does not have significant tachycardia or hemodynamic instability for me to think he is having  significant withdrawals from the opiates, but nausea, vomiting and diarrhea could present opiate withdrawal; however, we have CT findings, which showed gastroenteritis as likely etiology and I think opiate withdrawal could be a lesser contributing factor. Start him on deep vein thrombosis prophylaxis and proton pump inhibitor as well as Maalox.   TOTAL TIME SPENT: 55 minutes.   CODE STATUS: The patient is full code.   ____________________________ Krystal Eaton, MD sa:cc D: 10/05/2012 22:16:45 ET T: 10/05/2012 22:34:11 ET JOB#: [id number]  cc: Krystal Eaton, MD, <Dictator> Krystal Eaton MD ELECTRONICALLY SIGNED 11/01/2012 15:15

## 2014-09-29 NOTE — Consult Note (Signed)
PATIENT NAME:  James [name], James [name] MR#:  [id number] DATE OF BIRTH:  08/06/1970  DATE OF CONSULTATION:  10/05/2012  REFERRING PHYSICIAN:   CONSULTING PHYSICIAN:  Heywood Tokunaga A. Egbert GaribaldiBird, MD  PRESENTING COMPLAINT: Nausea, vomiting and abdominal pain with diarrhea for 3 days.   HISTORY: A 44 year old white male with a history of narcotic abuse and dependence secondary to chronic shoulder and back pain seen in the Emergency Room now 3 times in the last 48 hours with nausea, vomiting and abdominal pain which began rather suddenly on Saturday with the initiation of diffuse abdominal pain, followed by innumerable episodes of both nausea and vomiting and diarrhea. He sought medical evaluation sought in the Emergency Room. He was found to have a  leukocytosis. CT scan today demonstrates diffuse changes within the small bowel consistent with infectious or inflammatory enteritis. There is a small amount of scant fluid. Surgical services were contacted because of the possibility of an intussusception seen on the CT scan.   ALLERGIES: None.   MEDICATIONS: The patient admits to using Vicodin up to a couple of days ago, Carafate, Catapres, Maalox and promethazine.   PAST MEDICAL HISTORY: Significant for chronic back pain and narcotic use, depression and anxiety, acid reflux, history of gastroenteritis in the past.   SOCIAL HISTORY: The patient is employed. Admits to smoking and occasional drinking. Urine toxicology is positive for ecstasy.   REVIEW OF SYSTEMS: As described above.   FAMILY HISTORY: Noncontributory.   PAST SURGICAL HISTORY: None.   PHYSICAL EXAMINATION:  VITAL SIGNS: Temperature is 99. Pulse of 64. Blood pressure is 129/84. Respiratory rate of 18. He is 5 feet 11, weight 188 pounds, BMI of 36.  GENERAL: Affect is normal.  LUNGS: Clear.  HEART: Regular rate and rhythm.  ABDOMEN: Soft, diffusely tender, but no scars, no hernias and no peritoneal signs.  RECTAL AND GENITOURINARY: Deferred.  EXTREMITIES:  Warm and well perfused.  BACK: Right lower back demonstrates a 4 cm linear abrasion in a vertical orientation over the right lower back. No cellulitis. No drainage. No bleeding.   LABORATORY VALUES: Urinalysis is negative. White count at 2:00 yesterday was 19.5, hemoglobin 16.6, hematocrit 47.6, platelet count 222,000. Urine drug screen is positive for opioids and MDMA. Liver function tests are normal. Electrolytes are unremarkable. Review of CT scan demonstrates findings as described above.   IMPRESSION: A 44 year old white male with infectious or inflammatory gastroenteritis-type picture. I do not see any clinical evidence of intussusception. Often times, the intussusception seen on radiographic evaluation of the abdomen in the form of CT scan is a very transient and natural phenomenon. I see no evidence of a lead point.   RECOMMENDATIONS: Medical admission and evaluation. No surgical issues at this time.   ____________________________ Redge GainerMark A. Egbert GaribaldiBird, MD mab:gb D: 10/05/2012 21:09:51 ET T: 10/05/2012 21:42:09 ET JOB#: [id number]  cc: Loraine LericheMark A. Egbert GaribaldiBird, MD, <Dictator> Denard Tuminello Kela MillinA Tahjai Schetter MD ELECTRONICALLY SIGNED 10/05/2012 22:11

## 2014-09-29 NOTE — Consult Note (Signed)
Brief Consult Note: Diagnosis: infectious gastroenteritis, no clinical signs of SBO.   Patient was seen by consultant.   Consult note dictated.   Recommend further assessment or treatment.   Discussed with Attending MD.   Comments: no surgical issues at this time.  recommend medical evaluation.  Electronic Signatures: Natale LayBird, Maylen Waltermire (MD)  (Signed 29-Apr-14 21:05)  Authored: Brief Consult Note   Last Updated: 29-Apr-14 21:05 by Natale LayBird, Delia Slatten (MD)

## 2014-09-29 NOTE — Discharge Summary (Signed)
PATIENT NAME:  James [name], James [name] MR#:  [id number] DATE OF BIRTH:  August 26, 1970  DATE OF ADMISSION:  10/05/2012 DATE OF DISCHARGE:  10/06/2012  DISCHARGE DIAGNOSES: 1.  Gastroenteritis.  2.  Leukocytosis.  3.  Sepsis.  4.  Polysubstance abuse.  5.  Tobacco abuse.  6.  Severe dehydration.   CONSULTANTS:  Dr. Natale LayMark Bird of surgery.  IMAGING STUDIES DONE:  Include a CAT scan of the abdomen and pelvis with contrast that showed diffuse gastroduodenal and small bowel thickening with mild distention of the small bowel, gastroenteritis suggested. Subtle intussusception although this is not clear.   ADMITTING HISTORY AND PHYSICAL AND HOSPITAL COURSE:  Please see detailed H and P dictated by Dr. Jacques NavyAhmadzia on 10/05/2012. In brief, this is a 44 year old Caucasian male patient with history of nephrolithiasis, chronic pain who abuses opiates, who presented to the hospital complaining of nausea, vomiting, abdominal pain. The patient was found to have gastroenteritis, severe dehydration, leukocytosis and was admitted to the hospitalist service.   The patient was started on antibiotics, aggressive IV fluid resuscitation with which his leukocytosis resolved. The patient felt much better, was tolerating his food with minimal abdominal pain. He was seen by Dr. Egbert GaribaldiBird of surgery who said there was no surgical indication and the intussusception was possibly not true on the CAT scan. The patient was advised to quit smoking and his polysubstance abuse.   Prior to discharge the patient had minimal epigastric tenderness on deep palpation. Vitals were in the normal range.  He tolerated his food and was discharged home to follow up with his primary care physician.   DISCHARGE MEDICATIONS: .  Acetaminophen hydrocodone 325/10, 1 tablet orally every 6 hours as needed for pain.  2.  Carafate 1 gram oral 4 times a day.  3.  Zofran 4 mg oral 3 times a day for nausea and vomiting.  4.  Protonix 40 mg oral once a day.  5.   Ciprofloxacin 5 mg oral 2 times a day for 7 days.  6.  Flagyl 5 mg oral 3 times a day for 7 days.   DISCHARGE INSTRUCTIONS: Regular light meals. The patient was given a work note to return to work 2 days after discharge.   TIME SPENT: Time spent on day of discharge in discharge activity was 38 minutes.    ____________________________ Molinda BailiffSrikar R. Jehiel Koepp, MD srs:ct D: 10/07/2012 16:16:15 ET T: 10/07/2012 18:23:52 ET JOB#: [id number]  cc: Wardell HeathSrikar R. Kiko Ripp, MD, <Dictator> Orie FishermanSRIKAR R Riyad Keena MD ELECTRONICALLY SIGNED 10/20/2012 13:55

## 2014-09-29 NOTE — H&P (Signed)
PATIENT NAME:  James [name], James [name] MR#:  [id number] DATE OF BIRTH:  27-Feb-1971  DATE OF ADMISSION:  04/18/2013  REFERRING PHYSICIAN: Emergency Room MD   ATTENDING PHYSICIAN: Kristine LineaJolanta Patsey Pitstick, MD   IDENTIFYING DATA: Mr. James [name] is a 44 year old male with history of opioid dependence.   CHIEF COMPLAINT: "It was stupid."   HISTORY OF PRESENT ILLNESS: Mr. James [name] was brought to the Emergency Room by his family after he overdosed on reportedly 20 tablets of 100 mg trazodone. He was brought to the Emergency Room by his wife. She noticed an empty bottle. The patient denies doing it in front of her or telling his wife about it. He was positive in the Emergency Room for multiple substances including benzodiazepines, opiates and MDMA. He reports that the reason he overdosed was that he was sick and tired of his family watching him very closely. The patient has a history of narcotic pain killer abuse and the family is well aware of his habit. They watch his behavior and apparently they can tell that he has been using. He denies adamantly denies using narcotic pain killers or any substances whatsoever. He went to [facility] 2 months ago for detox and he claims that he has been sober since; however, initially when he was evaluated by intake nurse he disclosed that soon after detox at the [facility] he started abusing tramadol, using an old prescription. He believes now that because tramadol is in controlled substance class together with opiates that it would show up on an drug screen as opiates. Therefore, he felt that he is not breaking his promise to his family by using tramadol. As above to us however he declared that he has been free of substances for several months now. He denies using alcohol. He does not like it. During his hospitalization at [facility] in June of 2013, the patient was diagnosed with depression and was started on Prozac. He did not continue treatment in the community and  is currently taking no medication. He denies that he had intention to die by overdosing and he is glad to be alive. There are no psychotic symptoms. There are no symptoms suggestive of bipolar mania. He denies recently having any depressive symptoms of anxiety.   PAST PSYCHIATRIC HISTORY: One hospitalization at [facility] over a year ago for detox and 2 months ago at [facility], also for opioid detox. There were no other suicide attempts. He does not have a provider and takes no psychotropic medications that are prescribed.   FAMILY PSYCHIATRIC HISTORY: None reported.   PAST MEDICAL HISTORY: Chronic back pain.   ALLERGIES: No known drug allergies.   MEDICATIONS ON ADMISSION: None.   SOCIAL HISTORY: He has been married for 15 years. He has no children. He is currently employed as a [employer] and has been holding this job for 2 years now. He lives with his wife. His mother is closely involved in his life as well as his brother. Apparently they all monitor the patient's behavior very closely and this was the reason why he felt that he is under too much scrutiny and overdosed.   REVIEW OF SYSTEMS: CONSTITUTIONAL: No fevers or chills. No weight changes.  EYES: No double or blurred vision.  ENT: No hearing loss.  RESPIRATORY: No cough or shortness of breath.  CARDIOVASCULAR: No chest pain or orthopnea.  GASTROINTESTINAL: No abdominal pain, nausea, vomiting or diarrhea.  GENITOURINARY: No incontinence or frequency.  ENDOCRINE: No heat or cold intolerance.  LYMPHATIC: No anemia or easy bruising.  INTEGUMENTARY: No acne or rash.  MUSCULOSKELETAL: No muscle or joint pain.  NEUROLOGIC: No tingling or weakness.  PSYCHIATRIC: See history of present illness for details.   PHYSICAL EXAMINATION: VITAL SIGNS: Blood pressure 116/70, pulse 76, respirations 20, temperature 97.4.  GENERAL: This is a well-developed male in no acute distress.  HEENT: The pupils are equal, round and  reactive to light. Sclerae anicteric.  NECK: Supple. No thyromegaly.  LUNGS: Clear to auscultation. No dullness to percussion.  HEART: Regular rhythm and rate. No murmurs, rubs or gallops.  ABDOMEN: Soft, nontender, nondistended. Positive bowel sounds.  MUSCULOSKELETAL: Normal muscle strength in all extremities.  SKIN: No rashes or bruises.  LYMPHATIC: No cervical adenopathy.  NEUROLOGIC: Cranial nerves II through XII are intact.   LABORATORY AND DIAGNOSTICS: Chemistries are within normal limits. Blood alcohol level zero. LFTs within normal limits. TSH 0.58. Urine tox screen is positive for benzodiazepines, opiates and MDMA. CBC within normal limits, except for white blood count of 11.4. Urinalysis is not suggestive of urinary tract infection. Serum acetaminophen and salicylates are low.   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   MENTAL STATUS EXAMINATION ON ADMISSION: The patient is alert and oriented to person, place, time and situation. He is pleasant, polite and cooperative. He is well groomed. He maintains good eye contact. His speech is of normal rhythm, rate and volume. Mood is fine with odd affect. He smiles or grins inappropriately. His thought process is logical with its own logic. He denies suicidal or homicidal ideation, but was admitted after a suicide attempt by medication overdose. There are no thoughts of hurting others. There are no delusions or paranoia. There are no auditory or visual hallucinations. His cognition is grossly intact. He registers three out of three and recalls three out of three objects after 5 minutes. He can spell "world" forwards and backwards. He knows the current president. His insight and judgment are limited.   SUICIDE RISK ASSESSMENT ON ADMISSION: This is a patient with history of opioid dependence who overdosed on pills. He is at increased risk of suicide   DIAGNOSES: AXIS I:  1.  Opiate dependence.  2. Mood disorder,  not otherwise specified.  AXIS II: Deferred.  AXIS III: Chronic pain.  AXIS IV: Mental illness, chronic pain, marital, family conflict.  AXIS V: Global assessment of functioning 25.   PLAN: The patient was admitted to [facility] Medicine unit for safety, stabilization and medication management. He was initially placed on suicide precautions and was closely monitored for any unsafe behavior. He underwent full psychiatric and risk assessment. He received pharmacotherapy, individual and group psychotherapy, substance abuse counseling and support from therapeutic milieu.  1.  Suicidal ideation: The patient is able to contract for safety.  2.  Opioid dependence. We will treat symptomatically.  3.  Mood: The patient adamantly denies any mood symptoms or anxiety.  4.  Substance abuse treatment: He is in denial. We will discuss the case with substance abuse counselor. 5.  Family conflict: He was offered to have a family meeting. So far he declined.  6.  Disposition: He will be discharged to home. ____________________________ Ellin Goodie. Jennet Maduro, MD jbp:sb D: 04/19/2013 15:18:32 ET T: 04/19/2013 15:57:10 ET JOB#: [id number]  cc: Soha Thorup B. Jennet Maduro, MD, <Dictator> Shari Prows MD ELECTRONICALLY SIGNED 04/22/2013 20:44

## 2014-10-01 NOTE — Discharge Summary (Signed)
PATIENT NAME:  James MunroeCAPPS, James [name] MR#:  [id number] DATE OF BIRTH:  1971/03/16  DATE OF ADMISSION:  11/07/2011 DATE OF DISCHARGE:  11/12/2011  HOSPITAL COURSE: See the dictated History and Physical for details of admission. This 44 year old man brought himself to the Emergency Room requesting to be admitted for detox from narcotics. He had only been taking his prescription narcotic medicine for his chronic pain but felt that it had become a problem and he has been unable to stop it. In addition, he had multiple symptoms of depression and appeared to be under a lot of stress at home. In the hospital, he was detoxed from opiates. He complained of some aches and pains, a little bit of nausea but generally stayed pretty physically stable. He was treated with clonidine, Imodium, p.r.n. Zofran, and p.r.n. Robaxin. He was also started on Prozac 10 mg a day for treatment of his depression and was started on Neurontin 300 mg 3 times a day for his chronic pain as well as anxiety. In the hospital, he did not show any dangerous or aggressive behavior. Did not voice any active suicidal ideation. He did attend some groups and showed improved insight and his mood became calmer. He did have one unexpected trauma during the hospital stay. He received news that his brother's wife had killed herself. The patient took this news appropriately and did not decompensate. At this point he is expressing a very appropriate intention to be supportive to his brother and family around this. The patient will be discharged with followup at the local mental health through [facility]. Current medications will be provided at discharge. He has been educated about the importance of staying in treatment for depression and staying active and getting treatment for his chronic pain and sticking to his resolution not to use opiates.   LABORATORY DATA: Chemistry panel on admission was completely normal except for a slightly elevated protein of no significance.  Drug screen was positive for opiates. CBC was entirely normal, acetaminophen negative. Salicylates very slightly elevated, not toxic.   DISCHARGE MEDICATIONS:  1. Trazodone 150 mg at night.  2. Prilosec 20 mg twice a day.  3. Robaxin 750 mg q. 8 hours p.r.n. for pain.  4. Hydroxyzine 50 mg Q. 6 hours p.r.n. for anxiety.  5. Neurontin 300 mg 3 times a day.  6. Prozac 10 mg per day.   MENTAL STATUS EXAM: Neatly dressed and groomed. Good interaction, appropriate eye contact, normal psychomotor activity. Speech normal rate, tone, and volume. Affect reactive, upbeat, appropriate. Mood stated as good but anxious to go home. Thoughts are lucid and directed with no evidence of loosening of associations or delusional thinking. Denies auditory or visual hallucinations. Denies any suicidal or homicidal ideation at all. Expresses optimism and an understanding of his outpatient treatment plan.   DISPOSITION: Discharge back home. Current medicine provided. Follow up with community mental health.   DIAGNOSIS PRINCIPLE AND PRIMARY:  AXIS I: Depression, not otherwise specified.   SECONDARY DIAGNOSES:  AXIS I: Opiate dependence.   AXIS II: Deferred.   AXIS III: Chronic pain, gastric reflux symptoms.   AXIS IV: Severe stress from joblessness and chronic pain.   AXIS V: Functioning at time of discharge: 55.    ____________________________ Audery AmelJohn T. Jerimie Mancuso, MD jtc:bjt D: 11/12/2011 09:36:09 ET T: 11/12/2011 12:48:41 ET JOB#: [id number]  cc: Audery AmelJohn T. Laakea Pereira, MD, <Dictator> Audery AmelJOHN T Lowell Makara MD ELECTRONICALLY SIGNED 11/12/2011 13:11

## 2014-10-01 NOTE — H&P (Signed)
PATIENT NAME:  James [name], James [name] MR#:  [id number] DATE OF BIRTH:  [birth year]  DATE OF ADMISSION:  07/04/2011  PRIMARY CARE PHYSICIAN: Not local.  REFERRING PHYSICIAN: Glennie Isle, MD  CHIEF COMPLAINT: Nausea, vomiting, diarrhea, and abdominal pain for four days.   HISTORY OF PRESENT ILLNESS: The patient is a 44 year old Caucasian male with a past medical history of back pain and kidney stones who presented to the ED with nausea, vomiting, diarrhea, and abdominal pain for four days. The patient started having diarrhea four days ago. He said he felt like he had a lot of gas in the abdomen. Then he developed nausea, vomiting and left upper quadrant abdominal pain. He has had nausea, vomiting, and diarrhea about 2 to 3 times a day. The diarrhea is watery, but no bloody stool or black stool. The patient also complains of fever, headache, and he is thirsty, but he denies any other symptoms. The patient denies any travel history, no history of eating leftover food or picnic recently. In the ED, a CAT scan showed exclusion of small bowel obstruction. The ED physician, Dr. Glennie Isle, evaluated the patient for abdominal pain, nausea, and vomiting, and to rule out small bowel obstruction.   PAST MEDICAL HISTORY:  1. Chronic back pain. 2. Kidney stone.   SOCIAL HISTORY: He smokes cigarettes, about 10 cigarettes a day for ten years. He denies any alcohol drinking or illicit drugs.   FAMILY HISTORY: No family history.  PAST SURGICAL HISTORY: None.   MEDICATIONS: Norco 10 mg/325 mg p.o. every 4 to 6 hours p.r.n.   DRUG ALLERGIES: No known drug allergies.  REVIEW OF SYSTEMS: CONSTITUTIONAL: The patient denies any fever or chills. No fatigue, but has a headache. No weight loss or weight gain. EYES: No blurred vision or double vision. ENT: No discharge from ear or nose. No posterior nasal drip. No epistaxis. RESPIRATORY: No shortness of breath, cough, sputum, or hemoptysis. CARDIOVASCULAR: No chest  pain, palpitation, or orthopnea. No nocturnal dyspnea or edema. GASTROINTESTINAL: Positive for abdominal pain, nausea, vomiting, and diarrhea. No melena or bloody stool. GU: No dysuria or hematuria. SKIN: No rash or jaundice. MUSCULOSKELETAL: No joint pain or edema. NEUROLOGY: No syncope, loss of consciousness, or seizure.   PHYSICAL EXAMINATION:   VITALS: Temperature 96.3, pulse 82, respirations 20, blood pressure 123/76, oxygen saturation 95%, pain scale 4/10.  GENERAL: The patient is alert, awake, and oriented, in no acute distress.   HEENT: Pupils are round, equal, and reactive to light and accommodation. Moist oral mucosa. Clear oropharynx.   NECK: Supple. No JVD or carotid bruit. No lymphadenopathy. No thyromegaly.   CARDIOVASCULAR: S1 and S2 regular rate and rhythm. No murmurs, rubs, or gallops.  PULMONARY: Bilateral air entry. No wheezing or rales.   ABDOMEN: Soft. Mild tenderness in the epigastric area. No rebound or rigidity. Murphy's sign negative. No obvious organomegaly. Bowel sounds present.   EXTREMITIES: No edema, clubbing, or cyanosis. No calf tenderness. Strong pedal pulses.   NEUROLOGY: Alert and oriented x3. No focal deficit.   LABS/STUDIES: CAT scan of the abdomen and pelvis shows mild distention of proximal small bowel loops. To exclude developing small bowel obstruction, followup three-way of abdomen is suggested.   Ultrasound of the abdomen showed gallbladder sludge versus cholelithiasis. No evidence of acute cholecystitis. Probable hepatic steatosis with possible focal fatty sparing adjacent to the gallbladder fossa.   Urinalysis is negative.  WBC 14.2, hemoglobin 7.3, and platelets 276. Lipase 168. Glucose 119, BUN 11, creatinine 0.98, sodium 143,  potassium 4.1, chloride 104, bicarbonate 27, total bilirubin 0.8, alkaline phosphatase 45, SGPT 38, and SGOT 31. Troponin level less than 0.02.   EKG showed normal sinus rhythm at 74 beats per minute   IMPRESSION:   1. Acute gastroenteritis.  2. Leukocytosis, possibly reaction.  3. Back pain.   PLAN OF TREATMENT: The patient will be admitted and will be placed for observation. We will start clear liquid and advance to regular diet as tolerated. In addition, we will start normal saline IV and give Cipro 400 mg IVPB every 12 hours. We will give Zofran and morphine p.r.n. for abdominal pain, nausea, and vomiting.  According to general surgery consult, the patient has no obvious small bowel obstruction and the gallbladder sludge is possibly due to nausea and vomiting and it is not significant clinically. We will repeat abdominal three-way x-ray tomorrow and repeat CBC. I discussed the patient's situation and the plan of treatment with the patient.   TIME SPENT: Approximately 50 minutes. ____________________________ Shaune PollackQing Pedram Goodchild, MD qc:slb D: 07/04/2011 16:11:19 ET    T: 07/04/2011 16:57:40 ET       JOB#: [id number] cc: Shaune PollackQing Kyjuan Gause, MD, <Dictator> Shaune PollackQING Nitza Schmid MD ELECTRONICALLY SIGNED 07/04/2011 19:03

## 2014-10-01 NOTE — Consult Note (Signed)
PATIENT NAME:  James [name], James [name] MR#:  [id number] DATE OF BIRTH:  1971-01-17  DATE OF CONSULTATION:  10/12/2011  REFERRING PHYSICIAN:   CONSULTING PHYSICIAN:  Ezzard StandingPaul Y. Amberley Hamler, MD  REASON FOR REFERRAL: Abdominal pain, nausea, vomiting and diarrhea.   HISTORY OF PRESENT ILLNESS: Patient is a 44 year old white male no significant past medical history who was hospitalized in January with abdominal pain associated with nausea and vomiting. At that time CT scan was done which was negative. Patient was placed on antibiotics and he was sent home when he got better. He was thought to have get viral gastroenteritis.   Patient comes back today with recurrent nausea, vomiting and diarrhea associated with severe crampy abdominal pain in mid abdomen starting two days ago. He also reported having some fevers and chills as well. There is no gross hematochezia, melena or coffee-ground emesis. In the Emergency Room he was found to have elevated white count. Patient was admitted for further evaluation and treatment.   REVIEW OF SYSTEMS: Patient has had fevers and chills but no fatigue or weakness. There are no visual or hearing changes. There is no chest pain or palpitations. There is no coughing or shortness of breath. GI symptoms have been described already. Rest of the review of symptoms are negative.   PAST MEDICAL HISTORY: History of kidney stones and chronic back pain. Again, he had a similar episode in January.   FAMILY HISTORY: Hypertension. There is no history of inflammatory bowel disease.   SOCIAL HISTORY: He smokes 1/2 pack a day. There is no alcohol history. Patient is currently unemployed.   ALLERGIES: He has no known drug allergies.   PHYSICAL EXAMINATION:  GENERAL: Patient is in mild distress because of the pain.   VITAL SIGNS: Afebrile, temperature 97.7, pulse 58, respirations 20, blood pressure 117/74, pulse ox 96% on room air.   HEAD AND NECK: Normocephalic, atraumatic head. Pupils are equally  reactive. Throat was clear. Neck was supple.   CARDIAC: Regular rhythm, rate without murmurs.   LUNGS: Clear bilaterally.   ABDOMEN: Normoactive bowel sounds, soft. There was some mild tenderness in the mid abdomen. There is no rebound or guarding. There is no hepatomegaly. There are no palpable masses. He had active bowel sounds.   EXTREMITIES: No clubbing, cyanosis, edema.   SKIN: Examination was normal.   NEUROLOGICAL: Examination was negative.   LABORATORY, DIAGNOSTIC, AND RADIOLOGICAL DATA: White count 22.0, hemoglobin 10.2, hematocrit 49.4, sodium 136, potassium 3.6, chloride 106, CO2 25, BUN 11, creatinine 1.12. Liver enzymes are normal. Blood cultures were pending. Three way x-ray showed possible ileus.   ASSESSMENT AND PLAN: This is a patient with recurrent bouts of nausea, vomiting and diarrhea associated with abdominal pain. Could be viral gastroenteritis again but it would be somewhat unusual to have it again so soon and nobody else is sick. At this point I recommended doing some endoscopic evaluations to find other causes such as inflammatory bowel disease. Will plan on doing an endoscopy tomorrow as well as colonoscopy if patient can tolerate the bowel prep. Thank you for the referral.   ____________________________ Ezzard StandingPaul Y. Bluford Kaufmannh, MD pyo:cms D: 10/13/2011 13:35:00 ET T: 10/13/2011 14:14:52 ET  JOB#: [id number] cc: Ezzard StandingPaul Y. Bluford Kaufmannh, MD, <Dictator>  Ezzard StandingPAUL Y Katrenia Alkins MD ELECTRONICALLY SIGNED 10/13/2011 15:37

## 2014-10-01 NOTE — H&P (Signed)
PATIENT NAME:  James [name], James [name] MR#:  [id number] DATE OF BIRTH:  1971/01/10  DATE OF ADMISSION:  11/07/2011  IDENTIFYING INFORMATION AND CHIEF COMPLAINT: A 44 year old man came into the emergency room seeking detoxification.   CHIEF COMPLAINT: "I got to get off these pills."   HISTORY OF PRESENT ILLNESS: The patient states that he wants to stop using Norco. He is currently using between 4 and 6 of the 10 mg strength narcotic pain relievers a day. He started using them about 5 years ago after an injury and has continued to get them prescribed chronically ever since. He feels that they have made it impossible for him to hold a job and to function normally. He is having conflict and difficulty at home with his family. Recently his mood has been getting increasingly depressed. He is feeling depressed most of the time. Not sleeping well at night. Feels run down during the day. Is having crying spells. Is more irritable. He has been out of work for several months and his mood has been getting worse throughout that time. He has been having some suicidal thoughts. No suicide attempts, but it was serious enough that he got the guns out of his own house. He is currently taking no other medication than the narcotics, which he just stopped in the last couple of days.   PAST PSYCHIATRIC HISTORY: No previous psychiatric hospitalizations. No previous suicide attempts. Apparently no previous treatment for depression or anxiety in the past. Has not made any specific attempts to get involved in substance abuse treatment in the community.   MEDICAL HISTORY: He has chronic back pain due to an injury that he had some years ago with an injury to his neck and his back. He denies having any other significant ongoing medical problems.   SOCIAL HISTORY: Patient is married. Has no children. Lives at home with his wife. Has not been able to work in many months. Seems to be having some difficulties in his relationship with his wife.    CURRENT MEDICATIONS: None.   ALLERGIES: No known drug allergies.   SUBSTANCE ABUSE HISTORY: Other than the narcotics he denies that he is drinking or using any other recreational drugs or has any other drug history.   REVIEW OF SYSTEMS: Complains of pain in his back, tingling in his legs, tingling in his hands often, all from his injury to his neck. Complains of sadness, fatigue, feeling sick to his stomach, having abdominal pain.   MENTAL STATUS EXAMINATION:  Neatly groomed man in hospital pajamas. Cooperative with the interview. Decreased psychomotor activity and decreased eye contact. Speech quiet but easy to understand. Affect dysphoric and became tearful during parts of the interview. Mood stated as depressed. Thoughts are overall lucid with no evidence of loosening of associations or delusional thinking. Denies hallucinations. Has had some suicidal thoughts without intent or plan. Denies homicidal ideation. Adequate judgment and insight.   PHYSICAL EXAMINATION:   GENERAL: The patient does not appear to be in acute distress.   SKIN: No skin lesions identified.   HEENT: Pupils equal and reactive. Face symmetric.   NECK: Somewhat tender to palpation.   BACK: Nontender. Full range of motion at extremities. Gait normal. Subjectively, decreased sensation in the tips of his fingers. Strength is symmetric throughout as are reflexes.   LUNGS: Clear to auscultation with no extra sounds.   HEART: Regular rate and rhythm.   ABDOMEN: Soft, nontender, normal bowel sounds.   SKIN: No skin lesions identified.   CURRENT  VITAL SIGNS: Temperature 97.6, pulse 88, respirations 20, blood pressure 124/85.   ASSESSMENT: A 44 year old man with opiate dependence but also with clear-cut depression as well as a chronic pain disorder for which he has not been getting any treatment with some recent suicidal thoughts. Needs hospitalization for not only the detox but stabilization of his mood and  arrangement for outpatient treatment.   TREATMENT PLAN: I put him on a Catapres patch to help with opiate withdrawal and have also prescribed p.r.n. Imodium, Vistaril, and Robaxin. Prilosec given for his stomach upset. Zofran p.r.n. for nausea. Trazodone 100 mg at night p.r.n. for sleep. I suggested to him that we go ahead and start him on an antidepressant and suggested Prozac 10 mg a day. He agrees to this as a starting dose. I also think that with his neurologic whole body pain, it would be reasonable to try gabapentin and have started him on 300 mg 3 times a day. He will be engaged in groups and activities. We will try and get collateral history, monitor vital signs and physical symptoms.   DIAGNOSIS PRINCIPLE AND PRIMARY:  AXIS I: Depression, not otherwise specified.   SECONDARY DIAGNOSES:  AXIS I: Opiate dependence.  AXIS II: No diagnosis.  AXIS III: Chronic back pain.  AXIS IV: Severe from being out of work, chronic addiction problems, and depression.  AXIS V: Functioning at time of admission 25.     ____________________________ Audery AmelJohn T. Doratha Mcswain, MD jtc:vtd D: 11/07/2011 20:21:00 ET T: 11/08/2011 07:42:59 ET JOB#: [id number]  cc: Audery AmelJohn T. Legend Pecore, MD, <Dictator> Audery AmelJOHN T Melbourne Jakubiak MD ELECTRONICALLY SIGNED 11/10/2011 11:34

## 2014-10-01 NOTE — Discharge Summary (Signed)
PATIENT NAME:  James [name], James [name]#:  [id number] DATE OF BIRTH:  June 29, 1970  DATE OF ADMISSION:  07/04/2011 DATE OF DISCHARGE:  07/05/2011  PRESENTING COMPLAINT: Nausea, vomiting and diarrhea for three days.   DISCHARGE DIAGNOSES:  1. Gastroenteritis, suspected viral.  2. Chronic back pain.  3. Acid reflux.  CONDITION ON DISCHARGE: Fair.   MEDICATIONS ON DISCHARGE:  1. Zofran 4 mg p.o. three times daily p.r.n.  2. Prilosec 20 mg p.o. daily.  3. Norco 10/325 mg one p.o. every 4 to 6 hours p.r.n.   DISCHARGE INSTRUCTIONS: Followup with your primary care physician in one week.   LABS/STUDIES: X-ray: Nonobstructive bowel gas pattern. Residual contrast within the bowel.   CBC is within normal limits.   Blood cultures: No growth in 36 hours.   CT of the abdomen and pelvis with contrast showed mild distention of proximal small bowel loop. Calcification within the prostate.   Ultrasound of the abdomen: Gallbladder sludge versus cholelithiasis. No evidence of acute cholecystitis. Probable hepatic steatosis with focal fatty sparing adjacent to the gallbladder fossa.   Urinalysis: Negative for urinary tract infection.   Comprehensive metabolic panel within normal limits, except albumin of 5.3 and total protein of 8.7.   BRIEF SUMMARY OF HOSPITAL COURSE: [name]. James [name] is a 44 year old Caucasian gentleman with past medical history of chronic back pain with narcotic dependence who came to the Emergency Room with: 1. Acute gastroenteritis which started this past Tuesday. The patient says he was not able to tolerate anything orally. He did have significant nausea and heartburn. He was admitted on the medical floor, started on IV fluids, clear liquids, and advanced to mechanical soft diet prior to discharge. His nausea and vomiting improved. He had decreased frequency of his diarrhea. The patient appeared to be euvolemic and well-hydrated prior to discharge. He will take over-the-counter p.r.n. Lomotil  as needed for diarrhea. It appears to be a viral syndrome.  2. Gastroesophageal reflux disease. Prilosec was recommended, over-the-counter.  3. Chronic back pain. The patient had been requesting narcotics. He normally gets it from his doctors in [place]. He was given a few days of Norco to go home with.   His hospital stay otherwise remained stable.   CODE STATUS: The patient remained a FULL CODE.   TIME SPENT: 40 minutes. ____________________________ James HailSona A. Allena KatzPatel, MD sap:slb D: 07/06/2011 07:24:00 ET T: 07/07/2011 09:21:28 ET JOB#: [id number]  cc: James [name] A. Allena KatzPatel, MD, <Dictator> James OraSONA A Missy Baksh MD ELECTRONICALLY SIGNED 07/12/2011 15:38

## 2014-10-01 NOTE — H&P (Signed)
PATIENT NAME:  James [name], James [name] MR#:  [id number] DATE OF BIRTH:  [birth year]  DATE OF ADMISSION:  10/12/2011  PRIMARY CARE PHYSICIAN: No local doctor.   CHIEF COMPLAINT: Abdominal pain, nausea, vomiting, and diarrhea x2 days.   HISTORY OF PRESENT ILLNESS: James [name] is a 44 year old Caucasian male who was admitted to this hospital in January when he had left upper quadrant abdominal pain associated with vomiting and diarrhea. At that time he had work-up including CAT scan of the abdomen that was unremarkable. He was treated with antibiotics and he got better. The patient came back today stating that he had nausea, vomiting, and diarrhea starting Friday 2 days ago associated with crampy abdominal pain located at the central abdominal area. The pain has eased off now to severity of 3 to 5 on a scale of 10. He also reported fever and chills. He reports no hematemesis. No coffee-ground emesis. No melena or hematochezia. No urinary symptoms. Evaluation in the Emergency Department reveals significant leukocytosis. The patient was admitted for further evaluation and treatment.   REVIEW OF SYSTEMS: CONSTITUTIONAL: The patient reports fever and chills. Denies fatigue. No sweating. EYES: No blurring of vision. No double vision. ENT: No hearing impairment. No sore throat. No dysphagia. CARDIOVASCULAR: No chest pain. No shortness of breath. No edema. No syncope. RESPIRATORY: No cough. No sputum production. No chest pain or shortness of breath. GASTROINTESTINAL: Reports nausea, vomiting, diarrhea, and crampy abdominal pain. GENITOURINARY: No dysuria. No frequency of urination. No hematuria. MUSCULOSKELETAL: No joint pain or swelling. No muscular pain or swelling. INTEGUMENTARY: No skin rash. No ulcers. NEUROLOGY: No focal weakness. No seizure activity. No headache. PSYCHIATRY: No anxiety. No depression. ENDOCRINE: No polyuria or polydipsia. No heat or cold intolerance.   PAST MEDICAL HISTORY:  1. In January admitted  with abdominal pain in left upper quadrant area associated with vomiting and diarrhea.  2. History of chronic back pain.  3. History of kidney stone.   FAMILY HISTORY: His mother suffers from hypertension.   SOCIAL HABITS: Chronic smoker of half a pack a day since age of 54. No history of alcoholism. No history of drug abuse.   SOCIAL HISTORY: The patient has a 12th grade education. He is unemployed.   ADMISSION MEDICATIONS: None.   ALLERGIES: No known drug allergies.   PHYSICAL EXAMINATION:   VITAL SIGNS: Blood pressure 124/85, respiratory rate 18, pulse 74, temperature 98.9, oxygen saturation 95% on room air.   GENERAL APPEARANCE: Young male laying in bed in no acute distress.   HEAD: No pallor. No icterus. No cyanosis.   EARS, NOSE, AND THROAT: Hearing was normal. Nasal mucosa, lips, tongue were normal.   EYES: Normal eyelids and conjunctivae. Pupils about 5 mm, equal and reactive to light.   NECK: Supple. Trachea at midline. No thyromegaly. No cervical lymphadenopathy. No masses.   HEART: Normal S1, S2. No S3, S4. No murmur. No gallop. No carotid bruits.   RESPIRATORY: Normal breathing pattern without use of accessory muscles. No rales. No wheezing.   ABDOMEN: Soft. There is minimal tenderness at the central abdominal area upon deep pressure. No rebound. No rigidity. Bowel sounds were active. No hepatosplenomegaly. No masses. No hernias.   MUSCULOSKELETAL: No joint swelling. No clubbing.   SKIN: No ulcers. No subcutaneous nodules.   NEUROLOGIC: Cranial nerves II through XII are intact. No focal motor deficit.   PSYCHIATRIC: The patient is alert and oriented x3. Mood and affect were normal.   LABORATORY, DIAGNOSTIC, AND RADIOLOGICAL DATA: CBC showed  leukocytosis with white count of 22,000, hemoglobin 17, hematocrit 49, platelet count 292. Liver function tests were normal. Serum glucose 116, BUN 11, creatinine 1.1, sodium 138, potassium 3.6.   ASSESSMENT:  1. Crampy  abdominal pain associated with nausea, vomiting, and diarrhea. The picture is more consistent with acute gastroenteritis.  2. Tobacco abuse.  3. History of kidney stone.  4. History of chronic back pain.   PLAN:  1. Blood cultures were taken.  2. Stool for culture and Clostridium difficile. 3. Empiric IV antibiotic using ciprofloxacin 400 mg twice a day along with Flagyl pending results of the stool culture and blood cultures.  4. Repeat CBC in the morning.  5. If the patient tolerates hydration and his white count is less and if his abdominal pain is improving, he can be discharged and followed up as an outpatient.  6. I will continue IV hydration.  7. Phenergan p.r.n. for nausea and vomiting.   TIME NEEDED TO EVALUATE THIS PATIENT: More than 45 minutes.   ____________________________ Carney CornersAmir M. Rudene Rearwish, MD amd:drc D: 10/12/2011 00:21:16 ET T: 10/12/2011 09:26:31 ET JOB#: [id number]  cc: Carney CornersAmir M. Rudene Rearwish, MD, <Dictator> Karolee OhsAMIR Dala DockM Felicite Zeimet MD ELECTRONICALLY SIGNED 10/12/2011 22:13

## 2014-10-01 NOTE — Discharge Summary (Signed)
PATIENT NAME:  James [name], James [name] MR#:  [id number] DATE OF BIRTH:  08-Apr-1971  DATE OF ADMISSION:  10/12/2011 DATE OF DISCHARGE:  10/15/2011  DISCHARGE DIAGNOSES:  1. Abdominal pain, nausea, vomiting, and diarrhea, likely due to duodenitis, much improved. On gluten-free diet, tolerating diet.  2. Reactive hyperglycemia and leukocytosis, improving.   SECONDARY DIAGNOSES:  1. Chronic back pain. 2. History of kidney stones.   CONSULTATION: Gastroenterology, Dr. Bluford Kaufmannh.   PROCEDURES/RADIOLOGY:  1. Esophagogastroduodenoscopy and colonoscopy on 05/06 by Dr. Bluford Kaufmannh showed severe duodenitis and normal colonoscopy.  2. Abdominal three-way with PA of chest on 05/04 showed possible ileus or gastroenteritis. No obstruction.   MAJOR LABORATORY PANEL: Stool for C. difficile was negative. Blood cultures x2 were negative on admission. Celiac disease panel was negative. Stool Hemoccult was negative.   HISTORY AND SHORT HOSPITAL COURSE: The patient is a 44 year old male with above-mentioned medical problems who was admitted for severe abdominal pain, nausea, vomiting, and diarrhea, was provided symptomatic treatment. Stool studies were sent off which were all negative. He was provided pain medication. Gastroenterology consultation was obtained with Dr. Bluford Kaufmannh who recommended upper and lower endoscopy, which was performed with results dictated above showing essentially severe duodenitis throughout the region. Celiac disease panel was sent off which was negative. Pathology is still pending. He was slowly started on gluten diet and he has been tolerating it fine. He is being discharged home in stable condition. He does not have any more nausea, vomiting, or diarrhea.    PERTINENT PHYSICAL EXAMINATION: VITAL SIGNS: On the date of discharge, his vital signs are as follows: Temperature 97.6, heart rate 64 per minute, respirations 20 per minute, blood pressure 114/77. He is saturating 96% on room air. CARDIOVASCULAR: S1, S2 normal.  No murmurs, rubs, or gallop. LUNGS: Clear to auscultation bilaterally. No rales, rhonchi or crepitation. ABDOMEN: Soft, benign. NEUROLOGIC: Nonfocal examination. Other physical examination remained at baseline.   DISCHARGE MEDICATIONS: Norco 10/325 mg 1 tablet p.o. every six hours as needed, 30 tablets provided, no refills.   DISCHARGE DIET: Regular. Eat light for the first meal and gluten-free diet.   DISCHARGE ACTIVITY: As tolerated.   DISCHARGE INSTRUCTIONS AND FOLLOW-UP:  1. The patient was instructed to follow-up with his primary care physician at [facility] (new PCP) in one to two weeks.  2. He will need follow-up with Dr. Bluford Kaufmannh in one to two weeks.  3. He will also recommended to follow-up with pain management doctors in [place] if he wishes.   TOTAL TIME DISCHARGING THIS PATIENT: 45 minutes.    ____________________________ Ellamae SiaVipul S. Sherryll BurgerShah, MD vss:ap D: 10/15/2011 17:41:22 ET T: 10/16/2011 12:27:57 ET JOB#: [id number]  cc: Andrina Locken S. Sherryll BurgerShah, MD, <Dictator> Ezzard StandingPaul Y. Bluford Kaufmannh, MD [facility] Ellamae SiaVIPUL S [facility] MD ELECTRONICALLY SIGNED 10/16/2011 16:52

## 2014-10-01 NOTE — Consult Note (Signed)
Pt seen and examined. Recurrent bouts of nausea/vomiting/diarrhea assoc. with diffuse abd pain, similar to episode in 1/13. Unusual for patient to have recurrent viral gastroenteritis so soon. Therefore, recommend endoscopic evaluations. Plan EGD and colonoscopy tomorrow if patient can tolerate bowel prep. Thanks.  Electronic Signatures: Lutricia Feilh, Edith Lord (MD)  (Signed on 05-May-13 11:08)  Authored  Last Updated: 05-May-13 11:08 by Lutricia Feilh, Angelisa Winthrop (MD)

## 2014-10-01 NOTE — Consult Note (Signed)
EGD showed severe duodenitis throughout. Bx taken. Colonoscopy was normal. Will order celiac sprue panel. Try gluten free diet until bx's are back. Continue PPI. [facility] Will check back on Wed.  Electronic Signatures: Lutricia Feilh, Nakia Remmers (MD)  (Signed on 06-May-13 16:53)  Authored  Last Updated: 06-May-13 16:53 by Lutricia Feilh, Lyndsay Talamante (MD)

## 2014-10-01 NOTE — Consult Note (Signed)
Brief Consult Note: Diagnosis: opiate dep and depression nos.   Patient was seen by consultant.   Recommend further assessment or treatment.   Orders entered.   Comments: Psychiatry: Patient seen. Opiate dependence but prob also depression may be bigger problem. Will admit voluntarily for depression and detox treatment. Patient agreeable. Orders done. See H&P for full evaluation.  Electronic Signatures: Audery Amellapacs, Mackinze Criado T (MD)  (Signed 31-May-13 11:07)  Authored: Brief Consult Note   Last Updated: 31-May-13 11:07 by Audery Amellapacs, Kirsta Probert T (MD)

## 2014-11-06 ENCOUNTER — Emergency Department
Admission: EM | Admit: 2014-11-06 | Discharge: 2014-11-06 | Disposition: A | Discharge status: Home / Self Care | Payer: Self-pay | Attending: Emergency Medicine | Admitting: Emergency Medicine

## 2014-11-06 ENCOUNTER — Encounter: Payer: Self-pay | Admitting: Emergency Medicine

## 2014-11-06 DIAGNOSIS — K029 Dental caries, unspecified: Secondary | ICD-10-CM | POA: Insufficient documentation

## 2014-11-06 DIAGNOSIS — Z720 Tobacco use: Secondary | ICD-10-CM | POA: Insufficient documentation

## 2014-11-06 HISTORY — DX: Cardiac murmur, unspecified: R01.1

## 2014-11-06 MED ORDER — IBUPROFEN 800 MG PO TABS: 800.0000 mg | ORAL_TABLET | Freq: Three times a day (TID) | ORAL | Status: DC | PRN

## 2014-11-06 MED ORDER — OXYCODONE-ACETAMINOPHEN 10-325 MG PO TABS: 1.0000 | ORAL_TABLET | Freq: Four times a day (QID) | ORAL | Status: DC | PRN

## 2014-11-06 NOTE — Discharge Instructions (Signed)
Dental Pain °A tooth ache may be caused by cavities (tooth decay). Cavities expose the nerve of the tooth to air and hot or cold temperatures. It may come from an infection or abscess (also called a boil or furuncle) around your tooth. It is also often caused by dental caries (tooth decay). This causes the pain you are having. °DIAGNOSIS  °Your caregiver can diagnose this problem by exam. °TREATMENT  °· If caused by an infection, it may be treated with medications which kill germs (antibiotics) and pain medications as prescribed by your caregiver. Take medications as directed. °· Only take over-the-counter or prescription medicines for pain, discomfort, or fever as directed by your caregiver. °· Whether the tooth ache today is caused by infection or dental disease, you should see your dentist as soon as possible for further care. °SEEK MEDICAL CARE IF: °The exam and treatment you received today has been provided on an emergency basis only. This is not a substitute for complete medical or dental care. If your problem worsens or new problems (symptoms) appear, and you are unable to meet with your dentist, call or return to this location. °SEEK IMMEDIATE MEDICAL CARE IF:  °· You have a fever. °· You develop redness and swelling of your face, jaw, or neck. °· You are unable to open your mouth. °· You have severe pain uncontrolled by pain medicine. °MAKE SURE YOU:  °· Understand these instructions. °· Will watch your condition. °· Will get help right away if you are not doing well or get worse. °Document Released: 05/26/2005 Document Revised: 08/18/2011 Document Reviewed: 01/12/2008 °Exit[employer]® Patient Information ©2015 [employer]. This information is not intended to replace advice given to you by your health care provider. Make sure you discuss any questions you have with your health care provider. °OPTIONS FOR DENTAL FOLLOW UP CARE ° °[place] Department of Health and Human Services - Local Safety Net Dental  Clinics °http://www.ncdhhs.gov/dph/oralhealth/services/safetynetclinics.htm °  °[facility] (336-562-3123) ° °[facility] (919-933-9087) ° °[facility] (919-663-1744 ext 237) ° °[facility] (336-570-6415) ° °[facility] (919-968-2025) °This clinic caters to the indigent population and is on a lottery system. °Location: °[facility], [address], [place] °Clinic Hours: °Wednesdays from 6pm - 9pm, patients seen by a lottery system. °For dates, call or go to www.med.[facility].edu/[facility]/patients/Dental-[facility] °Services: °Cleanings, fillings and simple extractions. °Payment Options: °DENTAL WORK IS FREE OF CHARGE. Bring proof of income or support. °Best way to get seen: °Arrive at 5:15 pm - this is a lottery, NOT first come/first serve, so arriving earlier will not increase your chances of being seen. °  °  °[facility] °919-537-3737 °Select option 1 for emergencies °  °Location: °[facility], [address], [place] °Clinic Hours: °No walk-ins accepted - call the day before to schedule an appointment. °Check in times are 9:30 am and 1:30 pm. °Services: °Simple extractions, temporary fillings, pulpectomy/pulp debridement, uncomplicated abscess drainage. °Payment Options: °PAYMENT IS DUE AT THE TIME OF SERVICE.  Fee is usually $100-200, additional surgical procedures (e.g. abscess drainage) may be extra. °Cash, checks, Visa/MasterCard accepted.  Can file Medicaid if patient is covered for dental - patient should call case worker to check. °No discount for [facility] patients. °Best way to get seen: °MUST call the day before and get onto the schedule. Can usually be seen the next 1-2 days. No walk-ins accepted. °  °  °[facility] °919-933-9087 °  °Location: °[facility], [address], [place] °Clinic Hours: °M, W, Th, F 8am or 1:30pm, Tues  9a or 1:30 - first come/first served. °Services: °Simple extractions, temporary fillings, uncomplicated abscess drainage.  You do not need to be an [place] resident. °Payment Options: °PAYMENT IS DUE AT THE TIME OF SERVICE. °Dental insurance, otherwise sliding scale - bring proof of income or support. °Depending on income and treatment needed, cost is usually $50-200. °Best way to get seen: °Arrive early as it is first come/first served. °  °  °[facility] °919-542-1641 °  °Location: °[address] °Clinic Hours: °Mon-Thu 8a-5p °Services: °Most basic dental services including extractions and fillings. °Payment Options: °PAYMENT IS DUE AT THE TIME OF SERVICE. °Sliding scale, up to 50% off - bring proof if income or support. °Medicaid with dental option accepted. °Best way to get seen: °Call to schedule an appointment, can usually be seen within 2 weeks OR they will try to see walk-ins - show up at 8a or 2p (you may have to wait). °  °  °[facility] °919-245-2435 °[place] RESIDENTS ONLY °  °Location: °[facility], [address], [place], [place] [address] °Clinic Hours: By appointment only. °Monday - Thursday 8am-5pm, Friday 8am-12pm °Services: Cleanings, fillings, extractions. °Payment Options: °PAYMENT IS DUE AT THE TIME OF SERVICE. °Cash, Visa or MasterCard. Sliding scale - $30 minimum per service. °Best way to get seen: °Come in to office, complete packet and make an appointment - need proof of income °or support monies for each household member and proof of [place] residence. °Usually takes about a month to get in. °  °  °[facility] °919-956-4038 °  °Location: °[address] [place] °Clinic Hours: Walk-in Urgent Care Dental Services are offered Monday-Friday mornings only. °The numbers of emergencies accepted daily is limited to the number of °providers available. °Maximum 15 -  Mondays, Wednesdays & Thursdays °Maximum 10 - Tuesdays & Fridays °Services: °You do not need to be a [place] resident to be seen for a dental emergency. °Emergencies are defined as pain, swelling, abnormal bleeding, or dental trauma. Walkins will receive x-rays if needed. °NOTE: Dental cleaning is not an emergency. °Payment Options: °PAYMENT IS DUE AT THE TIME OF SERVICE. °Minimum co-pay is $40.00 for uninsured patients. °Minimum co-pay is $3.00 for Medicaid with dental coverage. °Dental Insurance is accepted and must be presented at time of visit. °Medicare does not cover dental. °Forms of payment: Cash, credit card, checks. °Best way to get seen: °If not previously registered with the clinic, walk-in dental registration begins at 7:15 am and is on a first come/first serve basis. °If previously registered with the clinic, call to make an appointment. °  °  °[facility] °919-776-4359 °[place] RESIDENTS ONLY °  °Location: °[address], [place], [place] °Clinic Hours: °Mon-Thu 10a-2p °Services: Extractions only! °Payment Options: °FREE (donations accepted) - bring proof of income or support °Best way to get seen: °Call and schedule an appointment OR come at 8am on the 1st Monday of every month (except for holidays) when it is first come/first served. °  °  °[facility] °[phone] °  °Location: °[address], [place] °Clinic Hours: °Friday mornings °Services, Payment Options, Best way to get seen: °Call for info °

## 2014-11-06 NOTE — ED Notes (Signed)
Tooth pain started on Saturday. Does not currently have a dentist and is trying to get one.  [facility] and was prescribed antibiotics and Percocet 5mg .  Has been taking antibiotics and took 2 Percocets total since getting prescription.  Pain never eased off and made him sick to his stomach.

## 2014-11-06 NOTE — ED Notes (Signed)
Pt [facility], reports that what they gave him is not helping him. They gave him Percocet and Augmentin. He reports that he was going to go back but came here because we are not as busy. Left side of face is slightly swollen.

## 2014-11-06 NOTE — ED Provider Notes (Signed)
[facility] Emergency Department Provider Note  ____________________________________________  Time seen: Approximately 12:29 PM  I have reviewed the triage vital signs and the nursing notes.   HISTORY  Chief Complaint Dental Pain    HPI James [name] is a 44 y.o. male presents with complaints of dental pain 3 days. States he went to [facility] on yesterday no relief. Currently has a prescription for antibiotics, would like to try some different for pain.   Past Medical History  Diagnosis Date  . Heart murmur     There are no active problems to display for this patient.   History reviewed. No pertinent past surgical history.  Current Outpatient Rx  Name  Route  Sig  Dispense  Refill  . ibuprofen (ADVIL,MOTRIN) 800 MG tablet   Oral   Take 1 tablet (800 mg total) by mouth every 8 (eight) hours as needed.   30 tablet   0   . oxyCODONE-acetaminophen (PERCOCET) 10-325 MG per tablet   Oral   Take 1 tablet by mouth every 6 (six) hours as needed for pain.   20 tablet   0     Allergies Review of patient's allergies indicates no known allergies.  No family history on file.  Social History History  Substance Use Topics  . Smoking status: Current Every Day Smoker  . Smokeless tobacco: Not on file  . Alcohol Use: No    Review of Systems Constitutional: No fever/chills Eyes: No visual changes. ENT: No sore throat. Positive dental caries. Cardiovascular: Denies chest pain. Respiratory: Denies shortness of breath. Gastrointestinal: No abdominal pain.  No nausea, no vomiting.  No diarrhea.  No constipation. Genitourinary: Negative for dysuria. Musculoskeletal: Negative for back pain. Skin: Negative for rash. Neurological: Negative for headaches, focal weakness or numbness.  10-point ROS otherwise negative.  ____________________________________________   PHYSICAL EXAM:  VITAL SIGNS: ED Triage Vitals  Enc Vitals Group     BP 11/06/14  1221 149/86 mmHg     Pulse Rate 11/06/14 1220 77     Resp 11/06/14 1220 18     Temp 11/06/14 1220 98.1 F (36.7 C)     Temp Source 11/06/14 1220 Oral     SpO2 11/06/14 1220 100 %     Weight 11/06/14 1220 187 lb (84.823 kg)     Height 11/06/14 1220  (1.803 m)     Head Cir --      Peak Flow --      Pain Score --      Pain Loc --      Pain Edu? --      Excl. in GC? --     Constitutional: Alert and oriented. Well appearing and in no acute distress. Eyes: Conjunctivae are normal. PERRL. EOMI. Head: Atraumatic. Nose: No congestion/rhinnorhea. Mouth/Throat: Mucous membranes are moist.  Oropharynx non-erythematous. Extensive dental caries especially in the left lower last 3 molars Neck: No stridor.   Cardiovascular: Normal rate, regular rhythm. Grossly normal heart sounds.  Good peripheral circulation. Respiratory: Normal respiratory effort.  No retractions. Lungs CTAB. Gastrointestinal: Soft and nontender. No distention. No abdominal bruits. No CVA tenderness. Musculoskeletal: No lower extremity tenderness nor edema.  No joint effusions. Neurologic:  Normal speech and language. No gross focal neurologic deficits are appreciated. Speech is normal. No gait instability. Skin:  Skin is warm, dry and intact. No rash noted. Psychiatric: Mood and affect are normal. Speech and behavior are normal.  ____________________________________________   LABS (all labs ordered are listed,  but only abnormal results are displayed)  Labs Reviewed - No data to display ____________________________________________  EKG  Not applicable ____________________________________________  RADIOLOGY  Not applicable ____________________________________________   PROCEDURES  Procedure(s) performed: None  Critical Care performed: No  ____________________________________________   INITIAL IMPRESSION / ASSESSMENT AND PLAN / ED COURSE  Pertinent labs & imaging results that were available during my  care of the patient were reviewed by me and considered in my medical decision making (see chart for details).  Continue current antibiotics. Follow-up with Dental as soon as possible. ____________________________________________   FINAL CLINICAL IMPRESSION(S) / ED DIAGNOSES  Final diagnoses:  Dental caries      Evangeline Dakinharles M Sherlynn [place], PA-C 11/06/14 1508  Phineas SemenGraydon Goodman, MD 11/06/14 [phone]

## 2015-01-01 ENCOUNTER — Emergency Department
Admission: EM | Admit: 2015-01-01 | Discharge: 2015-01-01 | Disposition: A | Discharge status: Home / Self Care | Payer: Self-pay | Attending: Emergency Medicine | Admitting: Emergency Medicine

## 2015-01-01 ENCOUNTER — Encounter: Payer: Self-pay | Admitting: Emergency Medicine

## 2015-01-01 DIAGNOSIS — G8929 Other chronic pain: Secondary | ICD-10-CM | POA: Insufficient documentation

## 2015-01-01 DIAGNOSIS — Z720 Tobacco use: Secondary | ICD-10-CM | POA: Insufficient documentation

## 2015-01-01 DIAGNOSIS — K088 Other specified disorders of teeth and supporting structures: Secondary | ICD-10-CM | POA: Insufficient documentation

## 2015-01-01 DIAGNOSIS — K029 Dental caries, unspecified: Secondary | ICD-10-CM | POA: Insufficient documentation

## 2015-01-01 DIAGNOSIS — K089 Disorder of teeth and supporting structures, unspecified: Secondary | ICD-10-CM

## 2015-01-01 MED ORDER — OXYCODONE-ACETAMINOPHEN 5-325 MG PO TABS: 1.0000 | ORAL_TABLET | ORAL | Status: DC | PRN

## 2015-01-01 MED ORDER — IBUPROFEN 800 MG PO TABS
800.0000 mg | ORAL_TABLET | Freq: Three times a day (TID) | ORAL | Status: DC
Start: 2015-01-01 — End: 2020-09-08

## 2015-01-01 MED ORDER — CLINDAMYCIN HCL 150 MG PO CAPS: 300.0000 mg | ORAL_CAPSULE | Freq: Four times a day (QID) | ORAL | Status: DC

## 2015-01-01 NOTE — Discharge Instructions (Signed)
KEEP YOUR DENTAL APPOINTMENT NEXT WEEK START CLEOCIN FOR INFECTION  AND IBUPROFEN FOR INFLAMMATION DECREASE SMOKING

## 2015-01-01 NOTE — ED Provider Notes (Signed)
[facility] Emergency Department Provider Note  ____________________________________________  Time seen: Approximately 5:06 PM  I have reviewed the triage vital signs and the nursing notes.   HISTORY  Chief Complaint Dental Pain   HPI James [name] is a 44 y.o. male is here with complaint of toothache. This is the same tooth that was hurting him back in May and he has not seen a dentist for it yet. He states he has an appointment next week in [facility]. At present there are 3 teeth on the left lower jaw that are hurting. This is been going on for 2 days at this time. He rates his pain a 6 out of 10. He denies any fever or chills. He continues to smoke a half pack cigarettes per day.   Past Medical History  Diagnosis Date  . Heart murmur     There are no active problems to display for this patient.   History reviewed. No pertinent past surgical history.  Current Outpatient Rx  Name  Route  Sig  Dispense  Refill  . clindamycin (CLEOCIN) 150 MG capsule   Oral   Take 2 capsules (300 mg total) by mouth 4 (four) times daily.   56 capsule   0   . ibuprofen (ADVIL,MOTRIN) 800 MG tablet   Oral   Take 1 tablet (800 mg total) by mouth 3 (three) times daily.   30 tablet   0   . oxyCODONE-acetaminophen (PERCOCET) 5-325 MG per tablet   Oral   Take 1 tablet by mouth every 4 (four) hours as needed for severe pain.   4 tablet   0     Allergies Review of patient's allergies indicates no known allergies.  History reviewed. No pertinent family history.  Social History History  Substance Use Topics  . Smoking status: Current Every Day Smoker  . Smokeless tobacco: Not on file  . Alcohol Use: No    Review of Systems Constitutional: No fever/chills Eyes: No visual changes. ENT: No sore throat. Cardiovascular: Denies chest pain. Respiratory: Denies shortness of breath. Gastrointestinal:  No nausea, no vomiting.  Genitourinary: Negative  for dysuria. Musculoskeletal: Negative for back pain. Skin: Negative for rash. Neurological: Negative for headaches, focal weakness or numbness.  10-point ROS otherwise negative.  ____________________________________________   PHYSICAL EXAM:  VITAL SIGNS: ED Triage Vitals  Enc Vitals Group     BP 01/01/15 1640 148/98 mmHg     Pulse Rate 01/01/15 1640 96     Resp 01/01/15 1640 20     Temp 01/01/15 1640 98.4 F (36.9 C)     Temp Source 01/01/15 1640 Oral     SpO2 01/01/15 1640 99 %     Weight 01/01/15 1640 185 lb (83.915 kg)     Height 01/01/15 1640 5\' 11"  (1.803 m)     Head Cir --      Peak Flow --      Pain Score 01/01/15 1643 6     Pain Loc --      Pain Edu? --      Excl. in GC? --     Constitutional: Alert and oriented. Well appearing and in no acute distress. Eyes: Conjunctivae are normal. PERRL. EOMI. Head: Atraumatic. Nose: No congestion/rhinnorhea. Mouth/Throat: Mucous membranes are moist.  Oropharynx non-erythematous. Left lower jaw there is some edema of the gum. There are 3 teeth that are fairly eroded and chronic caries noted. No abscess at present. Neck: No stridor.   Hematological/Lymphatic/Immunilogical: No  cervical lymphadenopathy. Cardiovascular: Normal rate, regular rhythm. Grossly normal heart sounds.  Good peripheral circulation. Respiratory: Normal respiratory effort.  No retractions. Lungs CTAB. Gastrointestinal: Soft and nontender. No distention Musculoskeletal: No lower extremity tenderness nor edema.  No joint effusions. Neurologic:  Normal speech and language. No gross focal neurologic deficits are appreciated. No gait instability. Skin:  Skin is warm, dry and intact. No rash noted. Psychiatric: Mood and affect are normal. Speech and behavior are normal.  ____________________________________________   LABS (all labs ordered are listed, but only abnormal results are displayed)  Labs Reviewed - No data to display  PROCEDURES  Procedure(s)  performed: None  Critical Care performed: No  ____________________________________________   INITIAL IMPRESSION / ASSESSMENT AND PLAN / ED COURSE  Pertinent labs & imaging results that were available during my care of the patient were reviewed by me and considered in my medical decision making (see chart for details).  Patient states he has an appointment in [facility] next week. He was started on clindamycin for infection ibuprofen for inflammation. He was given 4 tablets of Percocet for severe pain. ____________________________________________   FINAL CLINICAL IMPRESSION(S) / ED DIAGNOSES  Final diagnoses:  Chronic dental pain      Tommi Rumps, PA-C 01/01/15 1841  Emily Filbert, MD 01/01/15 2124

## 2015-01-01 NOTE — ED Notes (Signed)
Pt c/o toothache.  No resp distress

## 2015-01-02 ENCOUNTER — Ambulatory Visit
Admission: EM | Admit: 2015-01-02 | Discharge: 2015-01-02 | Disposition: A | Discharge status: Home / Self Care | Payer: Self-pay | Attending: Internal Medicine | Admitting: Internal Medicine

## 2015-01-02 DIAGNOSIS — G8929 Other chronic pain: Secondary | ICD-10-CM

## 2015-01-02 DIAGNOSIS — K029 Dental caries, unspecified: Secondary | ICD-10-CM

## 2015-01-02 DIAGNOSIS — K089 Disorder of teeth and supporting structures, unspecified: Secondary | ICD-10-CM

## 2015-01-02 MED ORDER — KETOROLAC TROMETHAMINE 60 MG/2ML IM SOLN
60.0000 mg | Freq: Once | INTRAMUSCULAR | Status: AC
  Administered 2015-01-02: 60 mg via INTRAMUSCULAR

## 2015-01-02 MED ORDER — PENICILLIN V POTASSIUM 500 MG PO TABS: 500.0000 mg | ORAL_TABLET | Freq: Four times a day (QID) | ORAL | Status: AC

## 2015-01-02 NOTE — Discharge Instructions (Signed)
° °  Please contact dental care for ASAP follow up   Dental Caries Dental caries is tooth decay. This decay can cause a hole in teeth (cavity) that can get bigger and deeper over time. HOME CARE  Brush and floss your teeth. Do this at least two times a day.  Use a fluoride toothpaste.  Use a mouth rinse if told by your dentist or doctor.  Eat less sugary and starchy foods. Drink less sugary drinks.  Avoid snacking often on sugary and starchy foods. Avoid sipping often on sugary drinks.  Keep regular checkups and cleanings with your dentist.  Use fluoride supplements if told by your dentist or doctor.  Allow fluoride to be applied to teeth if told by your dentist or doctor. Document Released: 03/04/2008 Document Revised: 10/10/2013 Document Reviewed: 05/28/2012 [facility] Patient Information 2015 [place], [place]. This information is not intended to replace advice given to you by your health care provider. Make sure you discuss any questions you have with your health care provider.  Dental Pain A tooth ache may be caused by cavities (tooth decay). Cavities expose the nerve of the tooth to air and hot or cold temperatures. It may come from an infection or abscess (also called a boil or furuncle) around your tooth. It is also often caused by dental caries (tooth decay). This causes the pain you are having. DIAGNOSIS  Your caregiver can diagnose this problem by exam. TREATMENT   If caused by an infection, it may be treated with medications which kill germs (antibiotics) and pain medications as prescribed by your caregiver. Take medications as directed.  Only take over-the-counter or prescription medicines for pain, discomfort, or fever as directed by your caregiver.  Whether the tooth ache today is caused by infection or dental disease, you should see your dentist as soon as possible for further care. SEEK MEDICAL CARE IF: The exam and treatment you received today has been provided on an  emergency basis only. This is not a substitute for complete medical or dental care. If your problem worsens or new problems (symptoms) appear, and you are unable to meet with your dentist, call or return to this location. SEEK IMMEDIATE MEDICAL CARE IF:   You have a fever.  You develop redness and swelling of your face, jaw, or neck.  You are unable to open your mouth.  You have severe pain uncontrolled by pain medicine. MAKE SURE YOU:   Understand these instructions.  Will watch your condition.  Will get help right away if you are not doing well or get worse. Document Released: 05/26/2005 Document Revised: 08/18/2011 Document Reviewed: 01/12/2008 [facility] Patient Information 2015 [place], [place]. This information is not intended to replace advice given to you by your health care provider. Make sure you discuss any questions you have with your health care provider.

## 2015-01-02 NOTE — ED Notes (Signed)
Patient complains of dental pain that started Friday night and states that he went to ER yesterday for same pain. Patient states that he was started on ibuprofen and antibiotic. Patient states that he was only given 4 percocets He states that he is still in pain today. Patient does not have a dentist currently, states that he is supposed to have tooth removed on Thursday next week.

## 2015-01-05 ENCOUNTER — Encounter: Payer: Self-pay | Admitting: Physician Assistant

## 2015-01-05 NOTE — ED Provider Notes (Signed)
CSN: [id number]     Arrival date & time 01/02/15  1209 History   First MD Initiated Contact with Patient 01/02/15 1307     Chief Complaint  Patient presents with  . Dental Pain   (Consider location/radiation/quality/duration/timing/severity/associated sxs/prior Treatment) HPI 44 yo M presents complaining of dental pain and requesting medication. He was seen yesterday at [facility] ER and given atb Rx and 4 Percocet . Chart review reveals he presented with same concerns in May 2016 and was advised to seek dental care. This has not been accomplished. He did not fill atb Rx .   Past Medical History  Diagnosis Date  . Heart murmur    Past Surgical History  Procedure Laterality Date  . No past surgeries     History reviewed. No pertinent family history. History  Substance Use Topics  . Smoking status: Current Every Day Smoker -- 0.50 packs/day  . Smokeless tobacco: Not on file  . Alcohol Use: No    Review of Systems Review of 10 systems negative for acute change except as referenced in HPI  Allergies  Review of patient's allergies indicates no known allergies.  Home Medications   Prior to Admission medications   Medication Sig Start Date End Date Taking? Authorizing Provider  clindamycin (CLEOCIN) 150 MG capsule Take 2 capsules (300 mg total) by mouth 4 (four) times daily. 01/01/15  Yes Tommi Rumps, PA-C  ibuprofen (ADVIL,MOTRIN) 800 MG tablet Take 1 tablet (800 mg total) by mouth 3 (three) times daily. 01/01/15  Yes Tommi Rumps, PA-C  oxyCODONE-acetaminophen (PERCOCET) 5-325 MG per tablet Take 1 tablet by mouth every 4 (four) hours as needed for severe pain. 01/01/15  Yes Tommi Rumps, PA-C  penicillin v potassium (VEETID) 500 MG tablet Take 1 tablet (500 mg total) by mouth 4 (four) times daily. 01/02/15 01/09/15  Rae Halsted, PA-C   BP 129/82 mmHg  Pulse 90  Temp(Src) 98.3 F (36.8 C) (Oral)  Resp 16  Ht  (1.803 m)  Wt 188 lb (85.276 kg)  BMI 26.23 kg/m2  SpO2  99% Physical Exam   Constitutional -alert and oriented,well appearing, reporting left lower jaw tooth pain. Afebrile. Head-atraumatic, normocephalic Eyes- conjunctiva normal, EOMI ,conjugate gaze Nose- no congestion or rhinorrhea Mouth/throat- mucous membranes moist ,oropharynx non-erythematous- left lower jaw with 3 fractured/decayed/worn appearing teeth. He states they are uncomfortable but does not flinch with tongue depressor tap. The gums are not inflamed or erythematous, no obvious swelling. He does report tenderness with palpation along jawline, mild shotty lymphadenopathy noted Neck- supple,see above CV- regular rate, grossly normal heart sounds,  Resp-no distress, normal respiratory effort,clear to auscultation bilaterally GI- no distention GU- not examined MSK- no tender, normal ROM, all extremities, ambulatory, self-care Neuro- normal speech and language, no gross focal neurological deficit appreciated, no gait instability, Skin-warm,dry ,intact; no rash noted Psych-mood and affect grossly normal; speech and behavior grossly normal ED Course  Procedures (including critical care time) Labs Review Labs Reviewed - No data to display  Imaging Review No results found.  Medications  ketorolac (TORADOL) injection 60 mg (60 mg Intramuscular Given 01/02/15 1327)   Was offered Rx, accepted and reported "some " relief of discomfort.   We discussed the need to address the problem to be relieved of the symptoms. Initially he reported his wife had made him an appointment for care for later this week- then reported it as next week. Discussed inflammation/discomfort in response to infection expected with long term tooth damage  and need to Rx with antibiotics. He is offered IM Rocephin and defers.   MDM   1. Dental caries   2. Chronic dental pain    Diagnosis and treatment discussed. He is given instructions to use ibuprofen 800 mg up to 3 times a day, always with meals, as he awaits  his dental care. Stop if any GI discomfort.Marland KitchenHe is given multiple handouts with information regarding local low cost dental care and encouraged to make contact with each for more information. He is perturbed that no narcotic will be offered but I defer given the chronic,recurrent presentation. He has a Clindamycin Rx from yesterday ER which he "doesn't expect" he will fill anticipating cost issue, though he hasn't checked price yet.. Advised that that would be the Rx of choice but if he is unable to address, he is given alternative Rx for PCN VK 500 mg quoted as $24 by pharmacy. Again offered IM atb Rx while here , and he defers.  Questions fielded, expectations and recommendations reviewed. Patient expresses understanding. Will return to [facility] with questions, concern or exacerbation.     Rae Halsted, PA-C 01/05/15 [phone]

## 2016-04-06 ENCOUNTER — Emergency Department
Admission: EM | Admit: 2016-04-06 | Discharge: 2016-04-06 | Disposition: A | Discharge status: Home / Self Care | Payer: Self-pay | Attending: Emergency Medicine | Admitting: Emergency Medicine

## 2016-04-06 ENCOUNTER — Emergency Department: Payer: Self-pay

## 2016-04-06 ENCOUNTER — Encounter: Payer: Self-pay | Admitting: Emergency Medicine

## 2016-04-06 DIAGNOSIS — Z791 Long term (current) use of non-steroidal anti-inflammatories (NSAID): Secondary | ICD-10-CM | POA: Insufficient documentation

## 2016-04-06 DIAGNOSIS — R0789 Other chest pain: Secondary | ICD-10-CM | POA: Insufficient documentation

## 2016-04-06 DIAGNOSIS — F17210 Nicotine dependence, cigarettes, uncomplicated: Secondary | ICD-10-CM | POA: Insufficient documentation

## 2016-04-06 LAB — COMPREHENSIVE METABOLIC PANEL
ALK PHOS: 48 U/L (ref 38–126)
ALT: 22 U/L (ref 17–63)
AST: 26 U/L (ref 15–41)
Albumin: 4.5 g/dL (ref 3.5–5.0)
Anion gap: 9 (ref 5–15)
BUN: 14 mg/dL (ref 6–20)
CO2: 26 mmol/L (ref 22–32)
CREATININE: 0.91 mg/dL (ref 0.61–1.24)
Calcium: 9.6 mg/dL (ref 8.9–10.3)
Chloride: 102 mmol/L (ref 101–111)
GFR calc Af Amer: >60 mL/min (ref >60)
GFR calc non Af Amer: >60 mL/min (ref >60)
GLUCOSE: 172 mg/dL — AB (ref 65–99)
Potassium: 4.2 mmol/L (ref 3.5–5.1)
SODIUM: 137 mmol/L (ref 135–145)
Total Bilirubin: 0.7 mg/dL (ref 0.3–1.2)
Total Protein: 7.5 g/dL (ref 6.5–8.1)

## 2016-04-06 LAB — TROPONIN I

## 2016-04-06 LAB — CBC
HEMATOCRIT: 50.7 % (ref 40.0–52.0)
HEMOGLOBIN: 17.4 g/dL (ref 13.0–18.0)
MCH: 30.5 pg (ref 26.0–34.0)
MCHC: 34.3 g/dL (ref 32.0–36.0)
MCV: 88.8 fL (ref 80.0–100.0)
Platelets: 228 10*3/uL (ref 150–440)
RBC: 5.7 MIL/uL (ref 4.40–5.90)
RDW: 13.2 % (ref 11.5–14.5)
WBC: 14.4 10*3/uL — ABNORMAL HIGH (ref 3.8–10.6)

## 2016-04-06 MED ORDER — NAPROXEN 500 MG PO TABS: 500.0000 mg | ORAL_TABLET | Freq: Two times a day (BID) | ORAL | 0 refills | Status: DC

## 2016-04-06 MED ORDER — OXYCODONE-ACETAMINOPHEN 5-325 MG PO TABS: 1.0000 | ORAL_TABLET | Freq: Three times a day (TID) | ORAL | 0 refills | Status: DC | PRN

## 2016-04-06 MED ORDER — PREDNISONE 20 MG PO TABS
40.0000 mg | ORAL_TABLET | Freq: Every day | ORAL | 0 refills | Status: DC
Start: 2016-04-06 — End: 2016-09-08

## 2016-04-06 NOTE — ED Provider Notes (Signed)
[facility] Emergency Department Provider Note  ____________________________________________  Time seen: Approximately 2:58 PM  I have reviewed the triage vital signs and the nursing notes.   HISTORY  Chief Complaint Rib Injury    HPI James [name] is a 45 y.o. male who reports that he was at home at rest walking around in his yard yesterday when he developed right lower chest wall pain. It hurts when he changes position, certain positions are better and worse than others, hurts to take a very deep breath but otherwise breathing is not painful. Not exertional. No shortness of breath diaphoresis nausea or vomiting. No recent travel trauma hospitalizations or surgeries. Never had any DVT or PE in the past. Just feels that the pain is very severe.     Past Medical History:  Diagnosis Date  . Heart murmur      There are no active problems to display for this patient.    Past Surgical History:  Procedure Laterality Date  . NO PAST SURGERIES       Prior to Admission medications   Medication Sig Start Date End Date Taking? Authorizing Provider  clindamycin (CLEOCIN) 150 MG capsule Take 2 capsules (300 mg total) by mouth 4 (four) times daily. 01/01/15   Tommi Rumpshonda L Summers, PA-C  ibuprofen (ADVIL,MOTRIN) 800 MG tablet Take 1 tablet (800 mg total) by mouth 3 (three) times daily. 01/01/15   Tommi Rumpshonda L Summers, PA-C  naproxen (NAPROSYN) 500 MG tablet Take 1 tablet (500 mg total) by mouth 2 (two) times daily with a meal. 04/06/16   Sharman CheekPhillip Zan Triska, MD  oxyCODONE-acetaminophen (ROXICET) 5-325 MG tablet Take 1 tablet by mouth every 8 (eight) hours as needed for severe pain. 04/06/16   Sharman CheekPhillip Mohamadou Maciver, MD  predniSONE (DELTASONE) 20 MG tablet Take 2 tablets (40 mg total) by mouth daily. 04/06/16   Sharman CheekPhillip Tanae Petrosky, MD     Allergies Review of patient's allergies indicates no known allergies.   History reviewed. No pertinent family history.  Social  History Social History  Substance Use Topics  . Smoking status: Current Every Day Smoker    Packs/day: 1.00    Years: 18.00    Types: Cigarettes  . Smokeless tobacco: Never Used  . Alcohol use No    Review of Systems  Constitutional:   No fever or chills.  ENT:   No sore throat. No rhinorrhea. Cardiovascular:   Positive as above chest pain. Respiratory:   No dyspnea or cough. Gastrointestinal:   Negative for abdominal pain, vomiting and diarrhea. Normal oral intake. Musculoskeletal:   Negative for focal pain or swelling  10-point ROS otherwise negative.  ____________________________________________   PHYSICAL EXAM:  VITAL SIGNS: ED Triage Vitals [04/06/16 1333]  Enc Vitals Group     BP 128/83     Pulse      Resp 16     Temp 97.9 F (36.6 C)     Temp Source Oral     SpO2 99 %     Weight 190 lb (86.2 kg)     Height 5\' 11"  (1.803 m)     Head Circumference      Peak Flow      Pain Score 5     Pain Loc      Pain Edu?      Excl. in GC?     Vital signs reviewed, nursing assessments reviewed.   Constitutional:   Alert and oriented. Well appearing and in no distress. Eyes:   No scleral icterus.  No conjunctival pallor. PERRL. EOMI.  No nystagmus. ENT   Head:   Normocephalic and atraumatic.   Nose:   No congestion/rhinnorhea. No septal hematoma   Mouth/Throat:   MMM, no pharyngeal erythema. No peritonsillar mass.    Neck:   No stridor. No SubQ emphysema. No meningismus. Hematological/Lymphatic/Immunilogical:   No cervical lymphadenopathy. Cardiovascular:   RRR. Symmetric bilateral radial and DP pulses.  No murmurs.  Respiratory:   Normal respiratory effort without tachypnea nor retractions. Breath sounds are clear and equal bilaterally. No wheezes/rales/rhonchi.Right anterior inferior ribs are tender to the touch, reproduces his symptoms. Essentially point tender in the intercostal space between the fall strips. Gastrointestinal:   Soft and nontender. Non  distended. There is no CVA tenderness.  No rebound, rigidity, or guarding. Genitourinary:   deferred Musculoskeletal:   Nontender with normal range of motion in all extremities. No joint effusions.  No lower extremity tenderness.  No edema. Neurologic:   Normal speech and language.  CN 2-10 normal. Motor grossly intact. No gross focal neurologic deficits are appreciated.  Skin:    Skin is warm, dry and intact. No rash noted.  No petechiae, purpura, or bullae.  ____________________________________________    LABS (pertinent positives/negatives) (all labs ordered are listed, but only abnormal results are displayed) Labs Reviewed  CBC - Abnormal; Notable for the following:       Result Value   WBC 14.4 (*)    All other components within normal limits  COMPREHENSIVE METABOLIC PANEL - Abnormal; Notable for the following:    Glucose, Bld 172 (*)    All other components within normal limits  TROPONIN I   ____________________________________________   EKG  Interpreted by me  Date: 04/06/2016  Rate: 97  Rhythm: normal sinus rhythm  QRS Axis: normal  Intervals: normal  ST/T Wave abnormalities: normal  Conduction Disutrbances: none  Narrative Interpretation: unremarkable      ____________________________________________    RADIOLOGY  Chest x-ray unremarkable except mild changes of COPD. I viewed this radiograph myself and agree with some bullous changes. No evidence of pneumothorax  ____________________________________________   PROCEDURES Procedures  ____________________________________________   INITIAL IMPRESSION / ASSESSMENT AND PLAN / ED COURSE  Pertinent labs & imaging results that were available during my care of the patient were reviewed by me and considered in my medical decision making (see chart for details).  Patient presents with chest wall pain.Considering the patient's symptoms, medical history, and physical examination today, I have low suspicion  for ACS, PE, TAD, pneumothorax, carditis, mediastinitis, pneumonia, CHF, or sepsis.  No history of trauma, no evidence of rib fracture. Have the patient continue NSAIDs, short course of steroids, very limited amount of Percocet as needed during the acute phase of this especially when trying to sleep at night. Counseled patient extensively, for approximately 6-10 minutes on smoking cessation, especially with the finding of some COPD changes on his chest x-ray with only a 15-pack-year smoking history. Work note provided     Clinical Course   ____________________________________________   FINAL CLINICAL IMPRESSION(S) / ED DIAGNOSES  Final diagnoses:  Acute chest wall pain  Tobacco abuse     Portions of this note were generated with dragon dictation software. Dictation errors may occur despite best attempts at proofreading.    Sharman CheekPhillip Ernestine Langworthy, MD 04/06/16 [phone]

## 2016-04-06 NOTE — ED Triage Notes (Signed)
Rt rib pain that pt states is extremely sore to the touch. Denies injury.

## 2016-04-30 ENCOUNTER — Encounter: Payer: Self-pay | Admitting: Emergency Medicine

## 2016-04-30 DIAGNOSIS — K0889 Other specified disorders of teeth and supporting structures: Secondary | ICD-10-CM | POA: Insufficient documentation

## 2016-04-30 DIAGNOSIS — F17210 Nicotine dependence, cigarettes, uncomplicated: Secondary | ICD-10-CM | POA: Insufficient documentation

## 2016-04-30 NOTE — ED Triage Notes (Signed)
Patient ambulatory to triage with steady gait, without difficulty or distress noted; pt reports left lower dental pain since Monday

## 2016-05-01 ENCOUNTER — Emergency Department
Admission: EM | Admit: 2016-05-01 | Discharge: 2016-05-01 | Disposition: A | Discharge status: Home / Self Care | Payer: Self-pay | Attending: Student in an Organized Health Care Education/Training Program | Admitting: Student in an Organized Health Care Education/Training Program

## 2016-05-01 DIAGNOSIS — K0889 Other specified disorders of teeth and supporting structures: Secondary | ICD-10-CM

## 2016-05-01 MED ORDER — TRAMADOL HCL 50 MG PO TABS
50.0000 mg | ORAL_TABLET | Freq: Once | ORAL | Status: AC
  Administered 2016-05-01: 50 mg via ORAL
  Filled 2016-05-01: qty 1

## 2016-05-01 MED ORDER — AMOXICILLIN 500 MG PO CAPS: 500.0000 mg | ORAL_CAPSULE | Freq: Three times a day (TID) | ORAL | 0 refills | Status: AC

## 2016-05-01 NOTE — ED Provider Notes (Signed)
[facility] Emergency Department Provider Note    First MD Initiated Contact with Patient 05/01/16 0100     (approximate)  I have reviewed the triage vital signs and the nursing notes.   HISTORY  Chief Complaint Dental Pain    HPI James [name] is a 45 y.o. male 5 days of left lower molar pain related to chronic dental fracture. Patient denies any fevers. States that the pain recently worsening as he was biting down and cracked the tooth. The patient has not followed up with dentistry despite having known dental caries and fractures. Denies any trouble swallowing. No trouble tolerating oral hydration. No sore throat. No shortness of breath or chest pain. No neck stiffness.   Past Medical History:  Diagnosis Date  . Heart murmur    No family history on file. Past Surgical History:  Procedure Laterality Date  . NO PAST SURGERIES     There are no active problems to display for this patient.     Prior to Admission medications   Medication Sig Start Date End Date Taking? Authorizing Provider  amoxicillin (AMOXIL) 500 MG capsule Take 1 capsule (500 mg total) by mouth 3 (three) times daily. 05/01/16 05/08/16  Willy EddyPatrick Georgean Spainhower, MD  clindamycin (CLEOCIN) 150 MG capsule Take 2 capsules (300 mg total) by mouth 4 (four) times daily. 01/01/15   Tommi Rumpshonda L Summers, PA-C  ibuprofen (ADVIL,MOTRIN) 800 MG tablet Take 1 tablet (800 mg total) by mouth 3 (three) times daily. 01/01/15   Tommi Rumpshonda L Summers, PA-C  naproxen (NAPROSYN) 500 MG tablet Take 1 tablet (500 mg total) by mouth 2 (two) times daily with a meal. 04/06/16   Sharman CheekPhillip Stafford, MD  oxyCODONE-acetaminophen (ROXICET) 5-325 MG tablet Take 1 tablet by mouth every 8 (eight) hours as needed for severe pain. 04/06/16   Sharman CheekPhillip Stafford, MD  predniSONE (DELTASONE) 20 MG tablet Take 2 tablets (40 mg total) by mouth daily. 04/06/16   Sharman CheekPhillip Stafford, MD    Allergies Patient has no known  allergies.    Social History Social History  Substance Use Topics  . Smoking status: Current Every Day Smoker    Packs/day: 1.00    Years: 18.00    Types: Cigarettes  . Smokeless tobacco: Never Used  . Alcohol use No    Review of Systems Patient denies headaches, rhinorrhea, blurry vision, numbness, shortness of breath, chest pain, edema, cough, abdominal pain, nausea, vomiting, diarrhea, dysuria, fevers, rashes or hallucinations unless otherwise stated above in HPI. ____________________________________________   PHYSICAL EXAM:  VITAL SIGNS: Vitals:   04/30/16 2247  BP: 137/77  Pulse: 100  Resp: 18  Temp: 98 F (36.7 C)    Constitutional: Alert and oriented. Well appearing and in no acute distress. Eyes: Conjunctivae are normal. PERRL. EOMI. Head: Atraumatic. Nose: No congestion/rhinnorhea. Mouth/Throat: Mucous membranes are moist.  Oropharynx non-erythematous.  Uvula midline, no trismus.  Old appearing fractures of 17 and 18 no area of abscess.  + gingivitis Neck: No stridor. Painless ROM. No cervical spine tenderness to palpation Hematological/Lymphatic/Immunilogical: No cervical lymphadenopathy. Cardiovascular: Normal rate Good peripheral circulation. Respiratory: Normal respiratory effort.  No retractions. Gastrointestinal: Soft and nontender. No distention. No abdominal bruits. No CVA tenderness. Musculoskeletal: No lower extremity tenderness nor edema.  No joint effusions. Neurologic:  Normal speech and language. No gross focal neurologic deficits are appreciated. No gait instability. Skin:  Skin is warm, dry and intact. No rash noted. Psychiatric: Mood and affect are normal. Speech and behavior are normal.  ____________________________________________   LABS (all labs ordered are listed, but only abnormal results are displayed)  No results found for this or any previous visit (from the past 24  hour(s)). ____________________________________________  EKG_________________________________  RADIOLOGY   ____________________________________________   PROCEDURES  Procedure(s) performed: none Procedures    Critical Care performed: o ____________________________________________   INITIAL IMPRESSION / ASSESSMENT AND PLAN / ED COURSE  Pertinent labs & imaging results that were available during my care of the patient were reviewed by me and considered in my medical decision making (see chart for details).  DDX: .dental caries, ludwigs, fracture tooth, rpa, pta  James [name] is a 45 y.o. who presents to the ED with dental pain last 5 days. No systemic symptoms. No fevers. Afebrile in ED. VSS. Exam as above. Poor dentition. Likely dental caries causing discomfort. Possible periapical abscess. No external focal drainable dental abscess identified. No evidence of Ludwig's, buccal cellulitis, mastoiditis, or airway compromise. Will treat pt with ABX, pain medication and dental referral. Low cost dental options handout provided to pt.   Clinical Course      ____________________________________________   FINAL CLINICAL IMPRESSION(S) / ED DIAGNOSES  Final diagnoses:  Pain, dental      NEW MEDICATIONS STARTED DURING THIS VISIT:  New Prescriptions   AMOXICILLIN (AMOXIL) 500 MG CAPSULE    Take 1 capsule (500 mg total) by mouth 3 (three) times daily.     Note:  This document was prepared using Dragon voice recognition software and may include unintentional dictation errors.    Willy EddyPatrick Simranjit Thayer, MD 05/01/16 [id number]

## 2016-05-01 NOTE — Discharge Instructions (Signed)
OPTIONS FOR DENTAL FOLLOW UP CARE ° °[place] Department of Health and Human Services - Local Safety Net Dental Clinics °http://www.ncdhhs.gov/dph/oralhealth/services/safetynetclinics.htm °  °[facility] (336-562-3123) ° °[facility] [place] (919-933-9087) ° °[facility] ([phone] ext [phone]) ° °[facility] ([phone]) ° °[facility] ([phone]) °This clinic caters to the indigent population and is on a lottery system. °Location: °[facility], [address], [place] °Clinic Hours: °Wednesdays from 6pm - 9pm, patients seen by a lottery system. °For dates, call or go to [email or web address].[facility].edu/[facility]/patients/Dental-[facility] °Services: °Cleanings, fillings and simple extractions. °Payment Options: °DENTAL WORK IS FREE OF CHARGE. Bring proof of income or support. °Best way to get seen: °Arrive at 5:15 pm - this is a lottery, NOT first come/first serve, so arriving earlier will not increase your chances of being seen. °  °  °[facility] °[phone] °Select option 1 for emergencies °  °Location: °[facility], [address], [place] °Clinic Hours: °No walk-ins accepted - call the day before to schedule an appointment. °Check in times are 9:30 am and 1:30 pm. °Services: °Simple extractions, temporary fillings, pulpectomy/pulp debridement, uncomplicated abscess drainage. °Payment Options: °PAYMENT IS DUE AT THE TIME OF SERVICE.  Fee is usually $100-200, additional surgical procedures (e.g. abscess drainage) may be extra. °Cash, checks, Visa/MasterCard accepted.  Can file Medicaid if patient is covered for dental - patient should call case worker to check. °No discount for [facility] patients. °Best way to get seen: °MUST call the day before and get onto the schedule. Can usually be seen the next 1-2 days. No walk-ins accepted. °  °  °[facility] °[phone] °   °Location: °[facility], [address], [place] °Clinic Hours: °M, W, Th, F 8am or 1:30pm, Tues 9a or 1:30 - first come/first served. °Services: °Simple extractions, temporary fillings, uncomplicated abscess drainage.  You do not need to be an [place] resident. °Payment Options: °PAYMENT IS DUE AT THE TIME OF SERVICE. °Dental insurance, otherwise sliding scale - bring proof of income or support. °Depending on income and treatment needed, cost is usually $50-200. °Best way to get seen: °Arrive early as it is first come/first served. °  °  °[facility] °[phone] °  °Location: °[address] °Clinic Hours: °Mon-Thu 8a-5p °Services: °Most basic dental services including extractions and fillings. °Payment Options: °PAYMENT IS DUE AT THE TIME OF SERVICE. °Sliding scale, up to 50% off - bring proof if income or support. °Medicaid with dental option accepted. °Best way to get seen: °Call to schedule an appointment, can usually be seen within 2 weeks OR they will try to see walk-ins - show up at 8a or 2p (you may have to wait). °  °  °[facility] °919-245-2435 °[place] RESIDENTS ONLY °  °Location: °[facility], [address], [place], [place] [address] °Clinic Hours: By appointment only. °Monday - Thursday 8am-5pm, Friday 8am-12pm °Services: Cleanings, fillings, extractions. °Payment Options: °PAYMENT IS DUE AT THE TIME OF SERVICE. °Cash, Visa or MasterCard. Sliding scale - $30 minimum per service. °Best way to get seen: °Come in to office, complete packet and make an appointment - need proof of income °or support monies for each household member and proof of [place] residence. °Usually takes about a month to get in. °  °  °[facility] °[phone] °  °Location: °[address]   [place] °Clinic Hours: Walk-in Urgent Care Dental Services are offered Monday-Friday  mornings only. °The numbers of emergencies accepted daily is limited to the number of °providers available. °Maximum 15 - Mondays, Wednesdays & Thursdays °Maximum 10 - Tuesdays & Fridays °Services: °You do not need to be a [place] resident to be seen for a dental emergency. °Emergencies are defined as pain, swelling, abnormal bleeding, or dental trauma. Walkins will receive x-rays if needed. °NOTE: Dental cleaning is not an emergency. °Payment Options: °PAYMENT IS DUE AT THE TIME OF SERVICE. °Minimum co-pay is $40.00 for uninsured patients. °Minimum co-pay is $3.00 for Medicaid with dental coverage. °Dental Insurance is accepted and must be presented at time of visit. °Medicare does not cover dental. °Forms of payment: Cash, credit card, checks. °Best way to get seen: °If not previously registered with the clinic, walk-in dental registration begins at 7:15 am and is on a first come/first serve basis. °If previously registered with the clinic, call to make an appointment. °  °  °[facility] °[phone] °[place] RESIDENTS ONLY °  °Location: °[address], [place], [place] °Clinic Hours: °Mon-Thu 10a-2p °Services: Extractions only! °Payment Options: °FREE (donations accepted) - bring proof of income or support °Best way to get seen: °Call and schedule an appointment OR come at 8am on the 1st Monday of every month (except for holidays) when it is first come/first served. °  °  °[facility] °[phone] °  °Location: °[address], [place] °Clinic Hours: °Friday mornings °Services, Payment Options, Best way to get seen: °Call for info °

## 2016-09-08 ENCOUNTER — Ambulatory Visit
Admission: EM | Admit: 2016-09-08 | Discharge: 2016-09-08 | Disposition: A | Discharge status: Home / Self Care | Payer: Self-pay | Attending: Family Medicine | Admitting: Family Medicine

## 2016-09-08 DIAGNOSIS — M62830 Muscle spasm of back: Secondary | ICD-10-CM

## 2016-09-08 MED ORDER — MELOXICAM 15 MG PO TABS: 15.0000 mg | ORAL_TABLET | Freq: Every day | ORAL | 0 refills | Status: DC

## 2016-09-08 MED ORDER — TRAMADOL HCL 50 MG PO TABS: 50.0000 mg | ORAL_TABLET | Freq: Four times a day (QID) | ORAL | 0 refills | Status: DC | PRN

## 2016-09-08 NOTE — ED Provider Notes (Signed)
CSN: [id number]     Arrival date & time 09/08/16  1807 History   First MD Initiated Contact with Patient 09/08/16 1932     Chief Complaint  Patient presents with  . Back Pain   (Consider location/radiation/quality/duration/timing/severity/associated sxs/prior Treatment) HPI  Is a 46 year old male thoracolumbar back pain slightly rightward midline to the L2- L3 level. He states that occasionally the pain will radiate into his right buttock to the midportion. As I remember specific incident of pain after or pushing. He does remember lifting a heavy object at work the day before the onset of the pain and he took his dog for a walk chase after a rabbit and it'll pull hard to restrain him. He awoke with his pain. I searched his profile on the [place] substance abuse and saw that he had numerous prescriptions from [place] [facility] last one ending in 2017. This was for previous shoulder injury. He states that he has been taking Mobic and Ultram that he had left over his shoulder injury. [place] any incontinence.      Past Medical History:  Diagnosis Date  . Heart murmur    Past Surgical History:  Procedure Laterality Date  . NO PAST SURGERIES     History reviewed. No pertinent family history. Social History  Substance Use Topics  . Smoking status: Current Every Day Smoker    Packs/day: 1.00    Years: 18.00    Types: Cigarettes  . Smokeless tobacco: Never Used  . Alcohol use No    Review of Systems  Constitutional: Positive for activity change. Negative for chills, fatigue and fever.  Musculoskeletal: Positive for back pain and myalgias.  All other systems reviewed and are negative.   Allergies  Patient has no known allergies.  Home Medications   Prior to Admission medications   Medication Sig Start Date End Date Taking? Authorizing Provider  ibuprofen (ADVIL,MOTRIN) 800 MG tablet Take 1 tablet (800 mg total) by mouth 3 (three) times daily. 01/01/15  Yes Tommi Rumps, PA-C   meloxicam (MOBIC) 15 MG tablet Take 1 tablet (15 mg total) by mouth daily. 09/08/16   Lutricia Feil, PA-C  traMADol (ULTRAM) 50 MG tablet Take 1 tablet (50 mg total) by mouth every 6 (six) hours as needed. 09/08/16   Lutricia Feil, PA-C   Meds Ordered and Administered this Visit  Medications - No data to display  BP 139/89 (BP Location: Left Arm)   Pulse (!) 111   Temp 98 F (36.7 C) (Oral)   Resp 18   Ht  (1.803 m)   Wt 187 lb (84.8 kg)   SpO2 98%   BMI 26.08 kg/m  No data found.   Physical Exam  Constitutional: He is oriented to person, place, and time. He appears well-developed and well-nourished. No distress.  HENT:  Head: Normocephalic and atraumatic.  Eyes: Pupils are equal, round, and reactive to light.  Neck: Normal range of motion. Neck supple.  Musculoskeletal: Normal range of motion. He exhibits tenderness.  Examination lumbar spine shows a palpable and visual spasm L2 lumbar level in the right paraspinous muscles. Flexion the patient is able to. Flex to the midshin level with his hands. When erect position is difficult with visible spasm in the right paraspinous muscles. Toe and heel walk adequately. DTRs are 2+ over 4 and symmetrical. Leg raise testing is negative at 90 in the sitting position bilaterally  Neurological: He is alert and oriented to person, place, and time.  Skin: Skin is warm and dry. He is not diaphoretic.  Psychiatric: He has a normal mood and affect. His behavior is normal. Judgment and thought content normal.  Nursing note and vitals reviewed.   Urgent Care Course     Procedures (including critical care time)  Labs Review Labs Reviewed - No data to display  Imaging Review No results found.   Visual Acuity Review  Right Eye Distance:   Left Eye Distance:   Bilateral Distance:    Right Eye Near:   Left Eye Near:    Bilateral Near:         MDM   1. Muscle spasm of back    Discharge Medication List as of 09/08/2016   7:49 PM    START taking these medications   Details  meloxicam (MOBIC) 15 MG tablet Take 1 tablet (15 mg total) by mouth daily., Starting Mon 09/08/2016, Normal    traMADol (ULTRAM) 50 MG tablet Take 1 tablet (50 mg total) by mouth every 6 (six) hours as needed., Starting Mon 09/08/2016, Print      Plan: 1. Test/x-ray results and diagnosis reviewed with patient 2. rx as per orders; risks, benefits, potential side effects reviewed with patient 3. Recommend supportive treatment with symptom avoidance and rest. Ice to the area 20 minutes out of every 2 hours 4 times daily follow-up with orthopedics this week or next week. 4. F/u prn if symptoms worsen or don't improve     Lutricia Feil, PA-C 09/08/16 2021

## 2016-09-08 NOTE — ED Triage Notes (Addendum)
Pt c/o lower mid back pain that started on Saturday he denies any trauma to it. He has had back issues before. The most comfortable position is laying on his back with pillow under his knees. He has taken  of ibuprofen with no relief, but he is having some relief with Mobic and Ultram.

## 2020-09-08 ENCOUNTER — Other Ambulatory Visit: Payer: Self-pay

## 2020-09-08 ENCOUNTER — Ambulatory Visit
Admission: EM | Admit: 2020-09-08 | Discharge: 2020-09-08 | Disposition: A | Discharge status: Home / Self Care | Payer: Self-pay | Attending: Physician Assistant | Admitting: Physician Assistant

## 2020-09-08 ENCOUNTER — Encounter: Payer: Self-pay | Admitting: Emergency Medicine

## 2020-09-08 DIAGNOSIS — M545 Low back pain, unspecified: Secondary | ICD-10-CM

## 2020-09-08 DIAGNOSIS — M546 Pain in thoracic spine: Secondary | ICD-10-CM

## 2020-09-08 MED ORDER — TRAMADOL HCL 50 MG PO TABS: 50.0000 mg | ORAL_TABLET | Freq: Four times a day (QID) | ORAL | 0 refills | Status: AC | PRN

## 2020-09-08 MED ORDER — METHYLPREDNISOLONE 4 MG PO TBPK: ORAL_TABLET | ORAL | 0 refills | Status: AC

## 2020-09-08 NOTE — ED Triage Notes (Addendum)
Patient in today c/o lower back pain x 2 days. Patient doesn't know of an acute injury, but states he did injure his back ~8years ago. Patient states he has taken OTC Ibuprofen, applied ice/heat and 1 dose of Meloxicam without relief. Patient states pain is worse with sitting or laying. Pain is less when standing. Patient denies any urinary symptoms.

## 2020-09-08 NOTE — ED Provider Notes (Signed)
[facility]    CSN: [id number] Arrival date & time: 09/08/20  1453      History   Chief Complaint Chief Complaint  Patient presents with  . Back Pain    HPI James [name] is a 50 y.o. male presenting for aching midline thoracic back pain and midline lumbar back pain x3 days.  Patient denies any injury.  He says that he just gradually noticed the pain.  He says that the pain is aching.  It does not radiate to the extremities.  He has no abdominal pain.  He denies any numbness, weakness or tingling.  No bowel or bladder incontinence or saddle anesthesia.  Pain is worse with sitting and position changes.  Increased pain also when lying down.  He has little bit less pain when he standing and walking.  Patient denies any urinary symptoms.  No fever or fatigue.  Has tried meloxicam, ibuprofen, and local ice and heat application.  He is also been doing stretches.  He says that none of his measures have relieved the pain.  Patient admits to a work-related injury about 8 to 10 years ago in which he potentially had a hairline fracture of one of his vertebrae.  He denies ever having to have surgery and denies having pain again until now.  No other concerns.  HPI  Past Medical History:  Diagnosis Date  . Heart murmur     There are no problems to display for this patient.   Past Surgical History:  Procedure Laterality Date  . NO PAST SURGERIES         Home Medications    Prior to Admission medications   Medication Sig Start Date End Date Taking? Authorizing Provider  methylPREDNISolone (MEDROL DOSEPAK) 4 MG TBPK tablet Take 6 tablets by mouth on day 1 and decrease by 1 tablet daily for the next 5 days 09/08/20  Yes Eusebio Friendly B, PA-C  traMADol (ULTRAM) 50 MG tablet Take 1 tablet (50 mg total) by mouth every 6 (six) hours as needed for up to 3 days. 09/08/20 09/11/20 Yes Shirlee Latch, PA-C    Family History Family History  Problem Relation Age of Onset  .  Hypertension Mother   . Hyperlipidemia Mother   . Other Father 69       homicide    Social History Social History   Tobacco Use  . Smoking status: Current Every Day Smoker    Packs/day: 0.50    Years: 23.00    Pack years: 11.50    Types: Cigarettes  . Smokeless tobacco: Never Used  Vaping Use  . Vaping Use: Never used  Substance Use Topics  . Alcohol use: No    Alcohol/week: 0.0 standard drinks  . Drug use: Never     Allergies   Patient has no known allergies.   Review of Systems Review of Systems  Constitutional: Negative for fatigue and fever.  Gastrointestinal: Negative for abdominal pain, nausea and vomiting.  Genitourinary: Negative for difficulty urinating, dysuria, flank pain and hematuria.  Musculoskeletal: Positive for back pain. Negative for gait problem.  Skin: Negative for rash.  Neurological: Negative for weakness and numbness.     Physical Exam Triage Vital Signs ED Triage Vitals  Enc Vitals Group     BP 09/08/20 1512 (!) 155/77     Pulse Rate 09/08/20 1512 (!) 102     Resp 09/08/20 1512 18     Temp 09/08/20 1512 (!) 97.5 F (36.4 C)  Temp Source 09/08/20 1512 Oral     SpO2 09/08/20 1512 100 %     Weight 09/08/20 1513 200 lb (90.7 kg)     Height 09/08/20 1513 5\' 11"  (1.803 m)     Head Circumference --      Peak Flow --      Pain Score 09/08/20 1511 7     Pain Loc --      Pain Edu? --      Excl. in GC? --    No data found.  Updated Vital Signs BP (!) 155/77 (BP Location: Left Arm)   Pulse (!) 102   Temp (!) 97.5 F (36.4 C) (Oral)   Resp 18   Ht 5\' 11"  (1.803 m)   Wt 200 lb (90.7 kg)   SpO2 100%   BMI 27.89 kg/m       Physical Exam Vitals and nursing note reviewed.  Constitutional:      General: He is not in acute distress.    Appearance: Normal appearance. He is well-developed. He is not ill-appearing.  HENT:     Head: Normocephalic and atraumatic.  Eyes:     General: No scleral icterus.    Conjunctiva/sclera:  Conjunctivae normal.  Cardiovascular:     Rate and Rhythm: Regular rhythm. Tachycardia present.     Heart sounds: Normal heart sounds.  Pulmonary:     Effort: Pulmonary effort is normal. No respiratory distress.     Breath sounds: Normal breath sounds.  Abdominal:     Palpations: Abdomen is soft.     Tenderness: There is no abdominal tenderness. There is no right CVA tenderness or left CVA tenderness.  Musculoskeletal:     Cervical back: Neck supple.     Thoracic back: Bony tenderness (TTP T11-12) present. Normal range of motion.     Lumbar back: Bony tenderness (TTP L4-S1) present. No spasms. Normal range of motion. Negative right straight leg raise test and negative left straight leg raise test.  Skin:    General: Skin is warm and dry.  Neurological:     General: No focal deficit present.     Mental Status: He is alert. Mental status is at baseline.     Motor: No weakness.     Gait: Gait normal.  Psychiatric:        Mood and Affect: Mood normal.        Behavior: Behavior normal.        Thought Content: Thought content normal.      UC Treatments / Results  Labs (all labs ordered are listed, but only abnormal results are displayed) Labs Reviewed - No data to display  EKG   Radiology No results found.  Procedures Procedures (including critical care time)  Medications Ordered in UC Medications - No data to display  Initial Impression / Assessment and Plan / UC Course  I have reviewed the triage vital signs and the nursing notes.  Pertinent labs & imaging results that were available during my care of the patient were reviewed by me and considered in my medical decision making (see chart for details).   50 year old male presenting for atraumatic thoracic back pain and lower back pain.  He has good range of motion.  He has a little bit of pain with lateral flexion and rotation but normal range of motion.  He does have spinal tenderness at multiple points of the  thoracic spine and L-spine.  Suspect strain of back.  Supportive care advised at this time.  Patient declines any muscle relaxers because he does not like the way that they make him feel.  Since he is not having any improvement with NSAIDs, will try Medrol.  I also sent a very short course of Ultram.  Advised him to continue with stretches and local ice and heat application.  I did review ED red flags with patient.  Advised him to follow-up with our department as needed.  Advised to follow-up with Ortho or PCP if back pain is not better in 4 to 6 weeks or for any worsening symptoms.   Final Clinical Impressions(s) / UC Diagnoses   Final diagnoses:  Acute midline low back pain without sciatica  Acute midline thoracic back pain     Discharge Instructions     BACK PAIN: Stressed avoiding painful activities . RICE (REST, ICE, COMPRESSION, ELEVATION) guidelines reviewed. May alternate ice and heat. Consider use of muscle rubs, Salonpas patches, etc. Use medications as directed including muscle relaxers if prescribed. Take anti-inflammatory medications as prescribed or OTC NSAIDs/Tylenol.  F/u with PCP in 7-10 days for reexamination, and please feel free to call or return to the urgent care at any time for any questions or concerns you may have and we will be happy to help you!   BACK PAIN RED FLAGS: If the back pain acutely worsens or there are any red flag symptoms such as numbness/tingling, leg weakness, saddle anesthesia, or loss of bowel/bladder control, go immediately to the ER. Follow up with [place] as scheduled or sooner if the pain does not begin to resolve or if it worsens before the follow up      ED Prescriptions    Medication Sig Dispense Auth. Provider   methylPREDNISolone (MEDROL DOSEPAK) 4 MG TBPK tablet Take 6 tablets by mouth on day 1 and decrease by 1 tablet daily for the next 5 days 21 tablet Eusebio Friendly B, PA-C   traMADol (ULTRAM) 50 MG tablet Take 1 tablet (50 mg total) by mouth  every 6 (six) hours as needed for up to 3 days. 10 tablet Shirlee Latch, PA-C     I have reviewed the PDMP during this encounter.   Shirlee Latch, PA-C 09/08/20 1550

## 2020-09-08 NOTE — Discharge Instructions (Addendum)
